# Patient Record
Sex: Female | Born: 1970 | Race: White | Hispanic: No | Smoking: Former smoker
Health system: Southern US, Community
[De-identification: ages and names within clinical notes are randomized; demographics above are authoritative.]

## PROBLEM LIST (undated history)

## (undated) DIAGNOSIS — E119 Type 2 diabetes mellitus without complications: Secondary | ICD-10-CM

## (undated) DIAGNOSIS — K219 Gastro-esophageal reflux disease without esophagitis: Secondary | ICD-10-CM

## (undated) DIAGNOSIS — F329 Major depressive disorder, single episode, unspecified: Secondary | ICD-10-CM

## (undated) DIAGNOSIS — F32A Depression, unspecified: Secondary | ICD-10-CM

## (undated) DIAGNOSIS — I10 Essential (primary) hypertension: Secondary | ICD-10-CM

## (undated) HISTORY — PX: ABDOMINAL HYSTERECTOMY: SHX81

## (undated) HISTORY — PX: ABLATION: SHX5711

## (undated) HISTORY — PX: TUBAL LIGATION: SHX77

## (undated) HISTORY — PX: TONSILLECTOMY AND ADENOIDECTOMY: SUR1326

## (undated) HISTORY — PX: BUNIONECTOMY: SHX129

## (undated) HISTORY — PX: KNEE SURGERY: SHX244

---

## 1997-05-15 ENCOUNTER — Inpatient Hospital Stay (HOSPITAL_COMMUNITY): Admission: AD | Admit: 1997-05-15 | Discharge: 1997-05-21 | Payer: Self-pay | Admitting: Obstetrics and Gynecology

## 1997-05-21 ENCOUNTER — Encounter: Admission: RE | Admit: 1997-05-21 | Discharge: 1997-08-19 | Payer: Self-pay | Admitting: Obstetrics & Gynecology

## 1998-08-06 ENCOUNTER — Other Ambulatory Visit: Admission: RE | Admit: 1998-08-06 | Discharge: 1998-08-06 | Payer: Self-pay | Admitting: Obstetrics & Gynecology

## 1998-12-03 ENCOUNTER — Other Ambulatory Visit: Admission: RE | Admit: 1998-12-03 | Discharge: 1998-12-03 | Payer: Self-pay | Admitting: Obstetrics & Gynecology

## 1999-05-05 ENCOUNTER — Other Ambulatory Visit: Admission: RE | Admit: 1999-05-05 | Discharge: 1999-05-05 | Payer: Self-pay | Admitting: Obstetrics & Gynecology

## 1999-05-06 ENCOUNTER — Other Ambulatory Visit: Admission: RE | Admit: 1999-05-06 | Discharge: 1999-05-06 | Payer: Self-pay | Admitting: Obstetrics & Gynecology

## 1999-10-16 ENCOUNTER — Inpatient Hospital Stay (HOSPITAL_COMMUNITY): Admission: AD | Admit: 1999-10-16 | Discharge: 1999-10-16 | Payer: Self-pay | Admitting: *Deleted

## 1999-10-27 ENCOUNTER — Ambulatory Visit (HOSPITAL_COMMUNITY): Admission: RE | Admit: 1999-10-27 | Discharge: 1999-10-27 | Payer: Self-pay | Admitting: Obstetrics & Gynecology

## 1999-10-27 ENCOUNTER — Encounter: Payer: Self-pay | Admitting: Obstetrics & Gynecology

## 1999-11-03 ENCOUNTER — Encounter: Payer: Self-pay | Admitting: Obstetrics and Gynecology

## 1999-11-04 ENCOUNTER — Inpatient Hospital Stay (HOSPITAL_COMMUNITY): Admission: AD | Admit: 1999-11-04 | Discharge: 1999-11-05 | Payer: Self-pay | Admitting: *Deleted

## 1999-11-12 ENCOUNTER — Inpatient Hospital Stay (HOSPITAL_COMMUNITY): Admission: AD | Admit: 1999-11-12 | Discharge: 1999-11-17 | Payer: Self-pay | Admitting: Obstetrics and Gynecology

## 1999-11-12 ENCOUNTER — Encounter: Payer: Self-pay | Admitting: Obstetrics and Gynecology

## 1999-11-14 ENCOUNTER — Encounter: Payer: Self-pay | Admitting: Obstetrics and Gynecology

## 1999-11-20 ENCOUNTER — Encounter: Payer: Self-pay | Admitting: Obstetrics and Gynecology

## 1999-11-20 ENCOUNTER — Encounter (INDEPENDENT_AMBULATORY_CARE_PROVIDER_SITE_OTHER): Payer: Self-pay

## 1999-11-20 ENCOUNTER — Inpatient Hospital Stay (HOSPITAL_COMMUNITY): Admission: AD | Admit: 1999-11-20 | Discharge: 1999-11-24 | Payer: Self-pay | Admitting: Obstetrics and Gynecology

## 1999-11-25 ENCOUNTER — Encounter: Admission: RE | Admit: 1999-11-25 | Discharge: 1999-12-10 | Payer: Self-pay | Admitting: Obstetrics & Gynecology

## 1999-11-27 ENCOUNTER — Inpatient Hospital Stay (HOSPITAL_COMMUNITY): Admission: AD | Admit: 1999-11-27 | Discharge: 1999-11-28 | Payer: Self-pay | Admitting: Obstetrics and Gynecology

## 2000-01-06 ENCOUNTER — Other Ambulatory Visit: Admission: RE | Admit: 2000-01-06 | Discharge: 2000-01-06 | Payer: Self-pay | Admitting: Obstetrics & Gynecology

## 2000-08-12 ENCOUNTER — Other Ambulatory Visit: Admission: RE | Admit: 2000-08-12 | Discharge: 2000-08-12 | Payer: Self-pay | Admitting: Obstetrics & Gynecology

## 2000-09-27 ENCOUNTER — Emergency Department (HOSPITAL_COMMUNITY): Admission: EM | Admit: 2000-09-27 | Discharge: 2000-09-27 | Payer: Self-pay | Admitting: Emergency Medicine

## 2001-08-28 ENCOUNTER — Other Ambulatory Visit: Admission: RE | Admit: 2001-08-28 | Discharge: 2001-08-28 | Payer: Self-pay | Admitting: Obstetrics & Gynecology

## 2001-09-22 ENCOUNTER — Ambulatory Visit (HOSPITAL_COMMUNITY): Admission: RE | Admit: 2001-09-22 | Discharge: 2001-09-22 | Payer: Self-pay | Admitting: Obstetrics & Gynecology

## 2002-09-28 ENCOUNTER — Other Ambulatory Visit: Admission: RE | Admit: 2002-09-28 | Discharge: 2002-09-28 | Payer: Self-pay | Admitting: Obstetrics and Gynecology

## 2003-10-07 ENCOUNTER — Other Ambulatory Visit: Admission: RE | Admit: 2003-10-07 | Discharge: 2003-10-07 | Payer: Self-pay | Admitting: Obstetrics & Gynecology

## 2004-08-07 ENCOUNTER — Encounter: Admission: RE | Admit: 2004-08-07 | Discharge: 2004-08-07 | Payer: Self-pay | Admitting: Sports Medicine

## 2004-11-04 ENCOUNTER — Other Ambulatory Visit: Admission: RE | Admit: 2004-11-04 | Discharge: 2004-11-04 | Payer: Self-pay | Admitting: Obstetrics & Gynecology

## 2007-04-10 ENCOUNTER — Emergency Department: Payer: Self-pay | Admitting: Emergency Medicine

## 2007-04-27 ENCOUNTER — Emergency Department: Payer: Self-pay | Admitting: Emergency Medicine

## 2007-10-05 ENCOUNTER — Encounter: Admission: RE | Admit: 2007-10-05 | Discharge: 2007-10-05 | Payer: Self-pay | Admitting: Gastroenterology

## 2007-10-05 HISTORY — PX: UPPER GI ENDOSCOPY: SHX6162

## 2007-11-10 ENCOUNTER — Ambulatory Visit: Payer: Self-pay | Admitting: Family Medicine

## 2008-02-20 ENCOUNTER — Encounter: Admission: RE | Admit: 2008-02-20 | Discharge: 2008-02-20 | Payer: Self-pay | Admitting: Orthopaedic Surgery

## 2009-04-08 LAB — HM COLONOSCOPY

## 2010-05-22 NOTE — Discharge Summary (Signed)
[facility] of [facility]  Patient:    Cindy Duncan, Cindy Duncan                            MRN: [id number] Adm. Date:  [id number] Disc. Date: [id number] Attending:  Cordelia Pen Ii Dictator:   Danie Chandler, R.N.                           Discharge Summary  ADMITTING DIAGNOSES:          Status post cesarean section at 65 1/2 weeks for toxemia.  Patient admitted with chills and temperature of 99.8, 99.4. Postpartum endometritis.  DISCHARGE DIAGNOSES:          Postpartum endometritis.  PROCEDURE:                    Intravenous antibiotics.  REASON FOR ADMISSION:         The patient is a 40 year old married white female gravida 3, para 2 who is status post cesarean section at 62 1/2 weeks for toxemia.  The patient received magnesium sulfate for 24 hours following delivery.  She was discharged home on November 24, 1999.  The patient was complaining of shaking and chills x 2 on November 25, 1999 and again on November 26, 1999.  Patient denied headache, visual change, epigastric pain. There was no periorbital edema.  Patients temperature was 99.8 and 99.4. Skin was hot and dry.  She had 1+ edema with 2+ reflexes without clonus.  On admission her hemoglobin was 10.7, white blood cell count 16.2 with neutrophils 82.  Creatinine 0.7, SGOT 47, SGPT 30, LDH 275, uric acid 7.2. The patient was admitted for probable postpartum endometritis and received IV antibiotics.  HOSPITAL COURSE:              On day #1 the patient had a temperature of 99.3. Her fundus was slightly tender.  She had no right upper quadrant tenderness. Reflexes were 2+ without clonus.  No edema.  Uric acid was 6.8.  LDH was 252. SGOT 46, SGPT 18.  Creatinine 0.6.  Hemoglobin 10.7, platelets 423,000.  On hospitalization day #2 the patient was discharged home.  She had been afebrile greater than 48 hours.  Her laboratories were good.  CONDITION ON DISCHARGE:       Good.  DIET:                         Regular,  as tolerated.  ACTIVITY:                     No heavy lifting, driving, vaginal entry.  FOLLOW-UP:                    She is to follow up in the office in one to two weeks.  She is to call for any PIH signs or symptoms or increased temperature.  DISCHARGE MEDICATIONS:        1. Augmentin 500 mg one t.i.d.                               2. Diflucan 150 mg x 1. DD:  12/22/99 TD:  12/22/99 Job: [id number]

## 2010-05-22 NOTE — H&P (Signed)
[facility] of [facility]  Patient:    Cindy Duncan, Cindy Duncan                            MRN: [id number] Adm. Date:  [id number] Disc. Date: [id number] Attending:  Rhina Brackett                         History and Physical  HISTORY OF PRESENT ILLNESS:   Cindy Duncan is a 40 year old, G3, A1, P1, with no living children, who has estimated date of confinement of January 10, 2000. Her pregnancy has been complicated by preeclampsia with recent increased in blood pressure over the last three to four weeks.  She has been on bed rest and hospitalized several times.  Two weeks ago, she had 3 g of protein in 24 hours with a normal creatinine clearance.  She has continued to maintain 2 to 3+ proteinuria with elevated blood pressures in the 140s to 150s/90s.  Today, she was seen in the office with 2 to 3+ protein, blood pressure 154/93.  Fetal heart rate was nonreactive.  She was sent to [facility] for biophysical which was 8/8.  Fetal monitoring after biophysical showed several decelerations after contractions.  She was given a contraction simulation test with nipple stimulation which was a positive CST with late decelerations after each contraction.  Because of positive CST and severe preeclampsia, plan to proceed with delivery.  The patient has had steroids two weeks ago on her hospitalization.  PHYSICAL EXAMINATION:  VITAL SIGNS:                  Blood pressure 158/93, 3+ proteinuria.  Weight is 172.  HEART:                        Regular rate and rhythm.  LUNGS:                        Clear to auscultation bilaterally.  ABDOMEN:                      Gravid, nontender.  PELVIC:                       Cervix is closed, thick, and high.  LABORATORY EVALUATION:        Significant for platelets of 249.  Uric acid 6.9.  LDH 198.  SGOT and SGPT within normal limits.  PAST SURGICAL HISTORY:        Significant for previous cesarean section with vertical cesarean section.  The  fetus died nine days later after severe IUGR.  IMPRESSION AND PLAN:          Intrauterine pregnancy at 32-5/7 weeks with severe preeclampsia and positive contraction stress test, also previous cesarean section with vertical for repeat cesarean section.  Plan is to deliver the fetus because the fetus has nonreassuring fetal surveillance and also severe preeclampsia.  Risks and benefits were discussed at length.  Informed consen6t was obtained.  We will plan magnesium sulfate after the cesarean section. DD:  11/20/99 TD:  11/20/99 Job: [id number]

## 2010-05-22 NOTE — Discharge Summary (Signed)
[facility]  Patient:    Cindy Duncan, Cindy Duncan                            MRN: [id number] Adm. Date:  [id number] Disc. Date: [id number] Attending:  Cordelia Pen Ii Dictator:   Danie Chandler, R.N.                           Discharge Summary  ADMITTING DIAGNOSES:          1. Intrauterine pregnancy at 31 1/[redacted] weeks                                  gestation.                               2. History of vertical cesarean section.                               3. History of intrauterine growth retardation,                                  pregnancy induced hypertension with last                                  pregnancy.                               4. Probable chronic hypertension with                                  superimposed pregnancy induced hypertension.  DISCHARGE DIAGNOSES:          1. Intrauterine pregnancy at 31 1/[redacted] weeks                                  gestation.                               2. History of vertical cesarean section.                               3. History of intrauterine growth retardation,                                  pregnancy induced hypertension with last                                  pregnancy.                               4. Probable chronic hypertension with  superimposed pregnancy induced hypertension.  REASON FOR ADMISSION:         The patient is a 40 year old gravida 2, para 0 with an estimated date of delivery of January 10, 2000.  The patient was admitted last week with increased blood pressure and 2-3+ proteinuria.  She received betamethasone.  She was discharged home on bed rest with daily RN care at home.  On November 12, 1999 the patient was complaining of increased facial edema.  In triage she had a reactive nonstress test, a BPP of 8/8.  Her AFI was 10.5 with her laboratories showing a UA with 30 mg of protein.  Her uric acid was 6.1, creatinine 0.7, SGOT and SGPT were  within normal limits. Hemoglobin was 13.3, hematocrit 37.2, white blood cell count 14.3, platelets 212,000.  The patient was admitted for repeat 24 hour urine and observation.  HOSPITAL COURSE:              On day #1 the patient was without headache, visual change, or right upper quadrant pain.  Her blood pressure was 112/83 and vital signs were stable.  Fetal heart tones were reactive.  Deep tendon reflexes were 2+ without clonus.  Her laboratories were okay.  BPP 10/10 with normal amniotic fluid volume.  The 24 hour urine was still in progress.  On hospitalization day #2 the patient was without complaints.  She continued to deny headache, visual change, or right upper quadrant pain.  Blood pressure 110/67, 94/57, and 128/90.  She had no right upper quadrant tenderness.  Deep tendon reflexes were 1+ without clonus.  She had no edema.  A 24 hour urine did show 1860 mg of protein.  On hospitalization day #3 the patients creatinine clearance was 125.  Nonstress test was reactive.  She continued to deny any headache, visual change, or right upper quadrant pain.  Blood pressure was stable.  On hospitalization day #4 blood pressure was still stable with reactive nonstress test.  She was discharged home on November 17, 1999.  CONDITION ON DISCHARGE:       Stable.  FOLLOW-UP:                    She is to follow up in the office on November 14 for ultrasound, nonstress test, and M.D. visit.  She is to take medications as previously ordered.  She is to be on bed rest with bathroom privileges only. She is to call for any signs or symptoms of PIH. DD:  12/22/99 TD:  12/22/99 Job: [id number]

## 2010-05-22 NOTE — Discharge Summary (Signed)
[facility] of [facility]  Patient:    Cindy Duncan, Cindy Duncan                            MRN: [id number] Adm. Date:  [id number] Disc. Date: 11/24/99 Attending:  Trevor Iha Dictator:   Danie Chandler, R.N.                           Discharge Summary  ADMITTING DIAGNOSES:          1. Intrauterine pregnancy at 32 5/[redacted] weeks                                  gestation.                               2. Severe preeclampsia.                               3. Nonreassuring fetal surveillance.                               4. Previous cesarean section of the vertical                                  fashion.  DISCHARGE DIAGNOSES:          1. Intrauterine pregnancy at 32 5/[redacted] weeks                                  gestation.                               2. Severe preeclampsia.                               3. Nonreassuring fetal surveillance.                               4. Previous cesarean section of the vertical                                  fashion.  PROCEDURE:                    On November 20, 1999 repeat low segment transverse cesarean section.  REASON FOR ADMISSION:         Please see dictated H&P.  HOSPITAL COURSE:              The patient was taken to the operating room and underwent the above named procedure without complication.  This was productive of a viable female infant with Apgars of 7 at one minute and 8 at five minutes. This baby did go to NICU.  On postoperative day #1 the patient was improved. She was on magnesium sulfate.  Her blood pressure was 130/70.  Her LDH was 421,  SGOT 45, SGPT 3, uric acid 7.1, platelets 202,000.  Her magnesium sulfate was weaned.  On postoperative day #2 the patient had good return of bowel function.  Her blood pressures were stable.  She was doing well off the magnesium sulfate.  On postoperative day #3 she was tolerating a regular diet, ambulating well without difficulty, had good pain control.  On postoperative day #4 she  was discharged home.  Hemoglobin 10.3, platelets 210,000.  She was in stable condition.  CONDITION ON DISCHARGE:       Good.  DIET:                         Regular, as tolerated.  ACTIVITY:                     No heavy lifting, driving, vaginal entry.  FOLLOW-UP:                    She is to follow up in the office in one to two weeks for incision check and she is to call for temperature greater than 100 degrees, any nausea or vomiting, or drainage from the incision site.  DISCHARGE MEDICATIONS:        1. Prenatal vitamin one p.o. q.d.                               2. Darvocet-N 100 #25 one to two p.o. q.4-6h.                                  p.r.n. pain. DD:  11/24/99 TD:  11/24/99 Job: [id number]

## 2010-05-22 NOTE — Op Note (Signed)
NAME:  Cindy Duncan, Cindy Duncan                             ACCOUNT NO.:  [id number]   MEDICAL RECORD NO.:  [id number]                   PATIENT TYPE:  AMB   LOCATION:  SDC                                  FACILITY:  [facility]   PHYSICIAN:  Freddy Finner, M.D.                DATE OF BIRTH:  [birth year]   DATE OF PROCEDURE:  DATE OF DISCHARGE:                                 OPERATIVE REPORT   PREOPERATIVE DIAGNOSIS:  Request for voluntary sterilization.   POSTOPERATIVE DIAGNOSIS:  Request for voluntary sterilization with slight  boggy enlargement of the uterus; no other significant pathology noted in the  pelvis or abdomen.   OPERATIVE PROCEDURE:  Laparoscopy, application of Filshie clips to fallopian  tubes.   INTRAOPERATIVE COMPLICATIONS:  None.   ESTIMATED INTRAOPERATIVE BLOOD LOSS:  Less than __________ 5 cc.   INDICATIONS:  The patient is a 40 year old white married female who has very  difficult pregnancies.  She has managed to achieve one viable pregnancy and  has one small child.  She has requested voluntary sterilization to avoid the  potential for further complicated pregnancies.   DESCRIPTION OF PROCEDURE:  She is admitted on the morning of surgery,  brought to the operating room, placed under adequate general anesthesia,  placed in the dorsal lithotomy position, Betadine prep of the abdomen,  perineum and vagina was carried out.  The bladder was evacuated with a  Robinson catheter.  Hulka tenaculum was attached to the cervix under direct  visualization.  Sterile drapes were applied.  Small intraumbilical skin  incision was made.  A #10-11 disposable trochar introduced while elevating  the anterior abdominal wall manually.  Direct inspection revealed adequate  placement and no evidence of injury on entry.  Pneumoperitoneum was allowed  to accumulate with carbon dioxide gas.  __________ inspection of the abdomen  and pelvis was carried out.  No apparent abnormalities were noted except  the  uterus was slightly boggy and a little irregular in overall contour that was  noted on definitive abnormal findings.  There was a recent corpus luteum on  the right ovary.  The was a scant amount of serosanguineous fluid in the  pelvis.  The Filshie clip applying device was evacuated through the  operating channel of the laparoscope and a Filshie clip applied to each  fallopian tube at the ascending portion without difficulty.  The procedure  was terminated at this point.  Photos were made and retained in the office  for documented findings.  Gas was allowed to escape from the abdomen and  instruments removed.  Half percent plain Marcaine was injected into the  incision site for postoperative analgesia.  The patient was given Toradol 30  mg IV.  The incision was closed with interrupted 3-0 Dexon sutures.  The  patient was awakened and taken to recovery in good condition.   INSTRUCTIONS TO PATIENT:  She will be given Darvocet to be taken  postoperatively as needed for pain which is unrelieved by ibuprofen.  She is  to return to the office in approximately 10-14 days for her postoperative  followup.  She was to call for fevers, severe pain, or heavy bleeding.                                               Freddy Finner, M.D.    WRN/MEDQ  D:  09/22/2001  T:  09/22/2001  Job:  [id number]

## 2010-05-22 NOTE — H&P (Signed)
[facility]  Patient:    Cindy Duncan, Cindy Duncan                            MRN: [id number] Adm. Date:  [id number] Attending:  Donne Hazel                         History and Physical  HISTORY OF PRESENT ILLNESS:   Ms. Cindy Duncan is a 40 year old G2, P1 with no living children who was seen in the office today with elevated blood pressures, blood pressure 140/98, with 3+ proteinuria (complicated by proteinuria in the last several weeks).  She had a 24-hour urine done on October 19, 1999, which showed a creatinine clearance of 172. and a total protein of 509 mg in 24 hours.  At that time, she was having 1+ proteinuria.  Today, she has 3+ proteinuria.  She denies any preeclampsia symptoms.  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  Her estimated date of confinement is January 10, 2000.  Ultrasound on October 12 showed an 1115 g infant which is the 50th percentile, amniotic fluid index of 16.9 which is 60th percentile in a vertex presentation.  PHYSICAL EXAMINATION:  VITAL SIGNS:                  Blood pressure is 140/98.  HEART:                        Regular rate and rhythm.  LUNGS:                        Clear to auscultation bilaterally.  ABDOMEN:                      Gravid, nontender.  Fundal height is 31 cm.  EXTREMITIES:                  DTRs are 2/4.  CERVIX:                       Deferred.  LABORATORY DATA:              Urinalysis:  3+ protein, weight 168.  IMPRESSION AND PLAN:          Intrauterine pregnancy at 30-2/7 weeks with probable preeclampsia.  Patient is currently on baby aspirin for her past medical history.  Plan is hospitalization for evaluation of blood pressure, 24-hour urine.  We will go ahead and give betamethasone in anticipation of preterm delivery and will also have fetal monitoring and physical  done to assess the fluid and well-being of the fetus. DD:  11/03/99 TD:  11/03/99 Job: [id number]

## 2010-05-22 NOTE — Op Note (Signed)
[facility] of [facility]  Patient:    Cindy Duncan, Cindy Duncan                            MRN: [id number] Proc. Date: 11/20/99 Adm. Date:  [id number] Disc. Date: [id number] Attending:  Rhina Brackett                           Operative Report  PREOPERATIVE DIAGNOSES:       Intrauterine pregnancy at 32 5/7 weeks, severe preeclampsia, nonreassuring fetal surveillance, previous cesarean section of the vertical fashion.  POSTOPERATIVE DIAGNOSES:      Intrauterine pregnancy at 32 5/7 weeks, severe preeclampsia, nonreassuring fetal surveillance, previous cesarean section of the vertical fashion.  PROCEDURE:                    Repeat cesarean section, low segment transverse cesarean section.  SURGEON:                      Trevor Iha, M.D.  ASSISTANT:                    Freddy Finner, M.D.  ANESTHESIA:                   Spinal.  ESTIMATED BLOOD LOSS:         800 cc.  INDICATIONS:                  [name] is a 40 year old G3, P1, A1 with no living children with severe preeclampsia, 3+ protein, elevated blood pressures status post betamethasone x 2.  Patient had a nonreactive NST today followed by an 8/8 biophysical profile, however, started having decelerations with fetal monitoring.  Contraction stress test was performed which was positive with late decelerations after each contraction.  Furthermore, with prolonged fetal monitoring occasional fetal bradycardia for one to two minutes would occur.  Because of nonreassuring fetal surveillance and severe preeclampsia, we proceeded with repeat cesarean section.  Her previous cesarean section was the vertical type for an infant with severe IUGR.  The baby expired nine days later.  FINDINGS:                     Viable female infant.  Apgars 7 and 8.  The pH is currently pending.  DESCRIPTION OF PROCEDURE:     After adequate analgesia, the patient placed in the supine position with left lateral tilt.  She is sterilely  prepped and draped.  Bladder was sterilely drained with a Foley catheter.  Pfannenstiel skin incision was made two fingerbreadths above the pubic symphysis which was taken to the fascia which was incised transversely, extended superiorly and inferiorly off the ______ rectus muscle which was separated sharply in the midline.  The peritoneum was then entered sharply.  Bladder blade was placed. Uterus serosa was elevated, nicked, incised transversely.  Bladder flap was created and placed behind the bladder blade.  A low segment myotomy incision was made down to the infants vertex and extended laterally with the operators fingertips.  The infants vertex was delivered atraumatically. Nares and pharynx were suctioned.  The infant was then delivered.  Cord was clamped.  Infant was handing to the pediatrician.  Good cry was noted.  Apgars were 7 and 8.  Uterus was exteriorized.  The placenta was extracted manually. The uterus was  wiped clean with a dry lap.  The myotomy incision was closed in two layers, the first being a running locking layer of 0 Monocryl, the second being an embrocating layer with good approximation and good hemostasis. Normal uterus, tubes, and ovaries were noted.  Uterus was placed back into the peritoneal cavity and after a copious amount of irrigation and adequate hemostasis was assured, the peritoneum was closed with 0 Panocryl, rectus muscle plicated in the midline.  Irrigation was once again applied and after adequate hemostasis the fascia was closed with a single layer of 0 Panocryl. Irrigation was once again applied and after adequate hemostasis the skin was stapled, Steri-Strips applied.  The patient tolerated procedure well.  Was stable on transfer to the recovery room.  The sponge, needle, and instrument count was normal x 3.  Estimated blood loss was 800 cc. DD:  11/20/99 TD:  11/20/99 Job: [id number]

## 2011-10-21 ENCOUNTER — Other Ambulatory Visit: Payer: Self-pay | Admitting: Gastroenterology

## 2011-10-21 DIAGNOSIS — R109 Unspecified abdominal pain: Secondary | ICD-10-CM

## 2011-10-25 ENCOUNTER — Ambulatory Visit
Admission: RE | Admit: 2011-10-25 | Discharge: 2011-10-25 | Disposition: A | Discharge status: Home / Self Care | Payer: PRIVATE HEALTH INSURANCE | Source: Ambulatory Visit | Attending: Gastroenterology | Admitting: Gastroenterology

## 2011-10-25 DIAGNOSIS — R109 Unspecified abdominal pain: Secondary | ICD-10-CM

## 2012-04-19 ENCOUNTER — Other Ambulatory Visit (HOSPITAL_COMMUNITY): Payer: Self-pay | Admitting: Obstetrics & Gynecology

## 2012-04-19 DIAGNOSIS — R1900 Intra-abdominal and pelvic swelling, mass and lump, unspecified site: Secondary | ICD-10-CM

## 2012-04-21 ENCOUNTER — Ambulatory Visit (HOSPITAL_COMMUNITY): Payer: PRIVATE HEALTH INSURANCE

## 2012-04-25 ENCOUNTER — Ambulatory Visit (HOSPITAL_COMMUNITY)
Admission: RE | Admit: 2012-04-25 | Discharge: 2012-04-25 | Disposition: A | Discharge status: Home / Self Care | Payer: BC Managed Care – PPO | Source: Ambulatory Visit | Attending: Obstetrics & Gynecology | Admitting: Obstetrics & Gynecology

## 2012-04-25 ENCOUNTER — Encounter (HOSPITAL_COMMUNITY): Payer: Self-pay

## 2012-04-25 DIAGNOSIS — N888 Other specified noninflammatory disorders of cervix uteri: Secondary | ICD-10-CM | POA: Insufficient documentation

## 2012-04-25 DIAGNOSIS — K7689 Other specified diseases of liver: Secondary | ICD-10-CM | POA: Insufficient documentation

## 2012-04-25 DIAGNOSIS — R1900 Intra-abdominal and pelvic swelling, mass and lump, unspecified site: Secondary | ICD-10-CM

## 2012-04-25 MED ORDER — IOHEXOL 300 MG/ML  SOLN
100.0000 mL | Freq: Once | INTRAMUSCULAR | Status: AC | PRN
  Administered 2012-04-25: 100 mL via INTRAVENOUS

## 2013-06-25 DIAGNOSIS — I493 Ventricular premature depolarization: Secondary | ICD-10-CM | POA: Insufficient documentation

## 2013-11-12 LAB — LIPID PANEL
Cholesterol: 231 mg/dL — AB (ref 0–200)
HDL: 49 mg/dL (ref 35–70)
LDL Cholesterol: 148 mg/dL
TRIGLYCERIDES: 170 mg/dL — AB (ref 40–160)

## 2013-11-12 LAB — CBC AND DIFFERENTIAL
HEMATOCRIT: 38 % (ref 36–46)
Hemoglobin: 13 g/dL (ref 12.0–16.0)
PLATELETS: 300 10*3/uL (ref 150–399)
WBC: 6 10^3/mL

## 2013-11-12 LAB — BASIC METABOLIC PANEL: Glucose: 78 mg/dL

## 2013-11-15 LAB — HEMOGLOBIN A1C: HEMOGLOBIN A1C: 5.6 % (ref 4.0–6.0)

## 2014-06-13 ENCOUNTER — Telehealth: Payer: Self-pay

## 2014-06-13 NOTE — Telephone Encounter (Signed)
Patient was seen on May 25th in [employer] system and all medications were refilled including Xanax 0.5 mg 1 po BID prn, #180 RF1. But pharmacy did not get it, spoke with pharmacist today-Laura-and gave a verbal. Patient advised.-aa

## 2014-11-05 DIAGNOSIS — F329 Major depressive disorder, single episode, unspecified: Secondary | ICD-10-CM | POA: Insufficient documentation

## 2014-11-05 DIAGNOSIS — R002 Palpitations: Secondary | ICD-10-CM | POA: Insufficient documentation

## 2014-11-05 DIAGNOSIS — D509 Iron deficiency anemia, unspecified: Secondary | ICD-10-CM | POA: Insufficient documentation

## 2014-11-05 DIAGNOSIS — R7303 Prediabetes: Secondary | ICD-10-CM | POA: Insufficient documentation

## 2014-11-05 DIAGNOSIS — G43909 Migraine, unspecified, not intractable, without status migrainosus: Secondary | ICD-10-CM | POA: Insufficient documentation

## 2014-11-05 DIAGNOSIS — I10 Essential (primary) hypertension: Secondary | ICD-10-CM | POA: Insufficient documentation

## 2014-11-05 DIAGNOSIS — F32A Depression, unspecified: Secondary | ICD-10-CM | POA: Insufficient documentation

## 2014-11-05 DIAGNOSIS — E669 Obesity, unspecified: Secondary | ICD-10-CM | POA: Insufficient documentation

## 2014-11-05 DIAGNOSIS — F410 Panic disorder [episodic paroxysmal anxiety] without agoraphobia: Secondary | ICD-10-CM | POA: Insufficient documentation

## 2014-11-05 DIAGNOSIS — F419 Anxiety disorder, unspecified: Secondary | ICD-10-CM | POA: Insufficient documentation

## 2014-11-05 DIAGNOSIS — E7800 Pure hypercholesterolemia, unspecified: Secondary | ICD-10-CM | POA: Insufficient documentation

## 2014-11-05 DIAGNOSIS — K219 Gastro-esophageal reflux disease without esophagitis: Secondary | ICD-10-CM | POA: Insufficient documentation

## 2014-11-05 DIAGNOSIS — Z79890 Hormone replacement therapy (postmenopausal): Secondary | ICD-10-CM | POA: Insufficient documentation

## 2014-11-05 DIAGNOSIS — F988 Other specified behavioral and emotional disorders with onset usually occurring in childhood and adolescence: Secondary | ICD-10-CM | POA: Insufficient documentation

## 2014-11-12 ENCOUNTER — Ambulatory Visit: Payer: Self-pay | Admitting: Family Medicine

## 2014-12-13 ENCOUNTER — Other Ambulatory Visit: Payer: Self-pay | Admitting: Family Medicine

## 2015-01-22 ENCOUNTER — Ambulatory Visit: Payer: Self-pay | Admitting: Family Medicine

## 2015-02-17 ENCOUNTER — Other Ambulatory Visit: Payer: Self-pay | Admitting: Family Medicine

## 2015-03-11 ENCOUNTER — Emergency Department
Admission: EM | Admit: 2015-03-11 | Discharge: 2015-03-11 | Disposition: A | Discharge status: Home / Self Care | Payer: BLUE CROSS/BLUE SHIELD | Attending: Emergency Medicine | Admitting: Emergency Medicine

## 2015-03-11 ENCOUNTER — Emergency Department: Payer: BLUE CROSS/BLUE SHIELD

## 2015-03-11 ENCOUNTER — Encounter: Payer: Self-pay | Admitting: Medical Oncology

## 2015-03-11 DIAGNOSIS — Z7984 Long term (current) use of oral hypoglycemic drugs: Secondary | ICD-10-CM | POA: Diagnosis not present

## 2015-03-11 DIAGNOSIS — Z79899 Other long term (current) drug therapy: Secondary | ICD-10-CM | POA: Insufficient documentation

## 2015-03-11 DIAGNOSIS — E119 Type 2 diabetes mellitus without complications: Secondary | ICD-10-CM | POA: Insufficient documentation

## 2015-03-11 DIAGNOSIS — H538 Other visual disturbances: Secondary | ICD-10-CM | POA: Insufficient documentation

## 2015-03-11 DIAGNOSIS — I10 Essential (primary) hypertension: Secondary | ICD-10-CM | POA: Diagnosis not present

## 2015-03-11 HISTORY — DX: Depression, unspecified: F32.A

## 2015-03-11 HISTORY — DX: Essential (primary) hypertension: I10

## 2015-03-11 HISTORY — DX: Major depressive disorder, single episode, unspecified: F32.9

## 2015-03-11 HISTORY — DX: Gastro-esophageal reflux disease without esophagitis: K21.9

## 2015-03-11 HISTORY — DX: Type 2 diabetes mellitus without complications: E11.9

## 2015-03-11 NOTE — ED Notes (Signed)
Patient transported to CT 

## 2015-03-11 NOTE — ED Provider Notes (Signed)
[facility] Emergency Department Provider Note  ____________________________________________  Time seen: 7:50 AM  I have reviewed the triage vital signs and the nursing notes.   HISTORY  Chief Complaint Eye Problem    HPI Cindy Duncan is a 45 y.o. female reports that around 7:00 AM as she was getting out of the shower this morning, she had a change in her vision with the left eye. It felt like when she was looking downward her left eye was crossed and she had blurry vision. No complaints in the right eye. No recent trauma. No drainage. No fevers or headaches. No numbness tingling or weakness. No neck pain. No recent eye injuries.     Past Medical History  Diagnosis Date  . Hypertension   . Diabetes mellitus without complication (HCC)   . Depression   . GERD (gastroesophageal reflux disease)      Patient Active Problem List   Diagnosis Date Noted  . Attention deficit disorder 11/05/2014  . Anxiety 11/05/2014  . Clinical depression 11/05/2014  . Gastro-esophageal reflux disease without esophagitis 11/05/2014  . Hypercholesterolemia 11/05/2014  . BP (high blood pressure) 11/05/2014  . Anemia, iron deficiency 11/05/2014  . Headache, migraine 11/05/2014  . Adiposity 11/05/2014  . Awareness of heartbeats 11/05/2014  . Panic attack 11/05/2014  . Need for prophylactic hormone replacement therapy (postmenopausal) 11/05/2014  . 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 Tubal ligation    . Ablation      uterine ablation  . Tonsillectomy and adenoidectomy    . Bunionectomy Bilateral   . Abdominal hysterectomy      complete  . Knee surgery      torn miniscus repair  . Cesarean section    . Upper gi endoscopy  10/05/07    normal stomach, duodenum, gerd without esophagitis     Current Outpatient Rx  Name  Route  Sig  Dispense  Refill  . ALPRAZolam (XANAX) 0.5 MG tablet      TAKE ONE TABLET BY  MOUTH TWICE DAILY AS NEEDED   180 tablet   1   . buPROPion (WELLBUTRIN XL) 300 MG 24 hr tablet   Oral   Take 300 mg by mouth daily.          . calcium carbonate (TUMS) 500 MG chewable tablet   Oral   Chew 2 tablets by mouth daily as needed for indigestion or heartburn.          . dexlansoprazole (DEXILANT) 60 MG capsule   Oral   Take 60 mg by mouth daily.          Marland Kitchen. docusate sodium (COLACE) 100 MG capsule   Oral   Take 100 mg by mouth daily.         Marland Kitchen. escitalopram (LEXAPRO) 5 MG tablet   Oral   Take 5 mg by mouth daily.          Marland Kitchen. estrogens, conjugated, (PREMARIN) 1.25 MG tablet   Oral   Take 1.25 mg by mouth daily.          . famotidine (PEPCID AC) 10 MG tablet   Oral   Take 10 mg by mouth daily.          Marland Kitchen. FIBER SELECT GUMMIES CHEW   Oral   Chew 2 Doses by mouth daily.          . hydrochlorothiazide (MICROZIDE) 12.5 MG capsule   Oral  Take 12.5 mg by mouth daily.         . magnesium oxide (MAG-OX) 400 MG tablet   Oral   Take 400 mg by mouth daily.          . metFORMIN (GLUCOPHAGE) 1000 MG tablet   Oral   Take 1,000 mg by mouth 2 (two) times daily with a meal.          . Multiple Vitamins-Iron (MULTI-VITAMIN/IRON PO)   Oral   Take 1 tablet by mouth every other day.         Marland Kitchen VICTOZA 18 MG/3ML SOPN      INJECT 1.2MCG UNDER THE SKIN DAILY   9 mL   2   . vitamin C (ASCORBIC ACID) 500 MG tablet   Oral   Take 500 mg by mouth daily.             Allergies Review of patient's allergies indicates no known allergies.   Family History  Problem Relation Age of Onset  . Hypothyroidism Mother   . Kidney disease Father   . Hypertension Father   . Dementia Father   . Muscular dystrophy Maternal Uncle   . Hypertension Paternal Aunt   . Stroke Maternal Grandmother   . Prostate cancer Maternal Grandfather   . Hypertension Paternal Grandmother   . Renal Disease Paternal Grandmother   . COPD Paternal Grandfather   . Hypertension  Paternal Grandfather   . Congestive Heart Failure Paternal Grandfather     Social History Social History  Substance Use Topics  . Smoking status: Never Smoker   . Smokeless tobacco: None  . Alcohol Use: None    Review of Systems  Constitutional:   No fever or chills. No weight changes Eyes:   Positive blurry vision of the left eye.  ENT:   No sore throat. No neck pain Cardiovascular:   No chest pain. No palpitations Skin:   Negative for rash. Neurological:   Negative for headaches, focal weakness or numbness. Psychiatric:  No anxiety or depression.     10-point ROS otherwise negative.  ____________________________________________   PHYSICAL EXAM:  VITAL SIGNS: ED Triage Vitals  Enc Vitals Group     BP 03/11/15 0738 145/94 mmHg     Pulse Rate 03/11/15 0738 90     Resp 03/11/15 0738 18     Temp 03/11/15 0738 98 F (36.7 C)     Temp Source 03/11/15 0738 Oral     SpO2 03/11/15 0738 97 %     Weight 03/11/15 0738 149 lb (67.586 kg)     Height --      Head Cir --      Peak Flow --      Pain Score --      Pain Loc --      Pain Edu? --      Excl. in GC? --     Vital signs reviewed, nursing assessments reviewed.   Constitutional:   Alert and oriented. Well appearing and in no distress. Eyes:   Normal appearance. No scleral icterus. No conjunctival pallor. PERRL. EOMI. Reports some discomfort with gaze to the left. Positive blurry vision. Funduscopy normal and the right eye was sharp optic discs, optic disc margins slightly blurred in the left. No pallor or hemorrhages in the fundus. ENT   Head:   Normocephalic and atraumatic. TMs normal bilaterally   Nose:   No congestion/rhinnorhea. No septal hematoma   Mouth/Throat:   MMM, no pharyngeal erythema. No  peritonsillar mass.    Neck:   No stridor. No SubQ emphysema. No meningismus. Hematological/Lymphatic/Immunilogical:   No cervical lymphadenopathy. Cardiovascular:   RRR. Symmetric bilateral radial and DP  pulses.  No murmurs.  Respiratory:   Normal respiratory effort without tachypnea nor retractions. Breath sounds are clear and equal bilaterally. No wheezes/rales/rhonchi. Neurologic:   Normal speech and language.  CN 2-10 normal. Motor grossly intact. No gross focal neurologic deficits are appreciated.  Psychiatric:   Mood and affect are normal. ____________________________________________    LABS (pertinent positives/negatives) (all labs ordered are listed, but only abnormal results are displayed) Labs Reviewed - No data to display ____________________________________________   EKG    ____________________________________________    RADIOLOGY  CT orbits unremarkable  ____________________________________________   PROCEDURES   ____________________________________________   INITIAL IMPRESSION / ASSESSMENT AND PLAN / ED COURSE  Pertinent labs & imaging results that were available during my care of the patient were reviewed by me and considered in my medical decision making (see chart for details).  Patient presents with blurry vision of the left eye. No evidence of any orbital or periorbital cellulitis. No evidence of any globe trauma or infection. Vital signs are unremarkable and normal. There does not appear to be any alteration and perfusion such as a retinal artery occlusion or venous occlusion. Low suspicion for glaucoma or temporal arteritis. CT scan performed to evaluate for mass effect or elevated pressure on the eye or optic neuritis, but found to be unremarkable. We'll have her follow-up with her ophthalmologist. She states that she has an established ophthalmologist and can see them in the next few days.     ____________________________________________   FINAL CLINICAL IMPRESSION(S) / ED DIAGNOSES  Final diagnoses:  Blurry vision, left eye      Sharman Cheek, MD 03/11/15 [phone]

## 2015-03-11 NOTE — ED Notes (Signed)
Pt reports at 0700 he left eye had some vision changes that lasted about 15 min. Pt denies headache and reports that the problem has now resolved. Denies weakness also.

## 2015-03-11 NOTE — Discharge Instructions (Signed)
Blurred Vision  Having blurred vision means that you cannot see things clearly. Your vision may seem fuzzy or out of focus. Blurred vision is a very common symptom of an eye or vision problem. Blurred vision is often a gradual blur that occurs in one eye or both eyes. There are many causes of blurred vision, including cataracts, macular degeneration, and diabetic retinopathy.  Blurred vision can be diagnosed based on your symptoms and a physical exam. Tell your health care provider about any other health problems you have, any recent eye injury, and any prior surgeries. You may need to see a health care provider who specializes in eye problems (ophthalmologist). Your treatment depends on what is causing your blurred vision.   HOME CARE INSTRUCTIONS   Tell your health care provider about any changes in your blurred vision.   Do not drive or operate heavy machinery if your vision is blurry.   Keep all follow-up visits as directed by your health care provider. This is important.  SEEK MEDICAL CARE IF:   Your symptoms get worse.   You have new symptoms.   You have trouble seeing at night.   You have trouble seeing up close or far away.   You have trouble noticing the difference between colors.  SEEK IMMEDIATE MEDICAL CARE IF:   You have severe eye pain.   You have a severe headache.   You have flashing lights in your field of vision.   You have a sudden change in vision.   You have a sudden loss of vision.   You have vision change after an injury.   You notice drainage coming from your eyes.   You notice a rash around your eyes.     This information is not intended to replace advice given to you by your health care provider. Make sure you discuss any questions you have with your health care provider.     Document Released: 12/24/2002 Document Revised: 05/07/2014 Document Reviewed: 11/14/2013  Elsevier Interactive Patient Education 2016 [employer]

## 2015-04-09 LAB — LIPID PANEL
CHOLESTEROL: 264 mg/dL — AB (ref 0–200)
HDL: 51 mg/dL (ref 35–70)
LDL Cholesterol: 139 mg/dL
LDl/HDL Ratio: 5.2
Triglycerides: 369 mg/dL — AB (ref 40–160)

## 2015-04-09 LAB — BASIC METABOLIC PANEL
BUN: 17 mg/dL (ref 4–21)
Creatinine: 0.8 mg/dL (ref 0.5–1.1)
GLUCOSE: 94 mg/dL
POTASSIUM: 4.2 mmol/L (ref 3.4–5.3)
Sodium: 138 mmol/L (ref 137–147)

## 2015-04-09 LAB — TSH: TSH: 1.82 u[IU]/mL (ref 0.41–5.90)

## 2015-04-09 LAB — HEPATIC FUNCTION PANEL
ALK PHOS: 68 U/L (ref 25–125)
ALT: 14 U/L (ref 7–35)
AST: 13 U/L (ref 13–35)
Bilirubin, Total: 0.2 mg/dL

## 2015-04-09 LAB — CBC AND DIFFERENTIAL
HEMATOCRIT: 44 % (ref 36–46)
Hemoglobin: 14.3 g/dL (ref 12.0–16.0)
NEUTROS ABS: 3 /uL
Platelets: 288 10*3/uL (ref 150–399)
WBC: 5.9 10*3/mL

## 2015-04-09 LAB — HEMOGLOBIN A1C: Hemoglobin A1C: 5.5

## 2015-04-24 ENCOUNTER — Encounter: Payer: Self-pay | Admitting: Family Medicine

## 2015-05-19 DIAGNOSIS — E119 Type 2 diabetes mellitus without complications: Secondary | ICD-10-CM | POA: Diagnosis not present

## 2015-05-19 LAB — HM DIABETES EYE EXAM

## 2015-06-11 ENCOUNTER — Encounter: Payer: Self-pay | Admitting: Family Medicine

## 2015-06-11 ENCOUNTER — Ambulatory Visit (INDEPENDENT_AMBULATORY_CARE_PROVIDER_SITE_OTHER): Payer: BLUE CROSS/BLUE SHIELD | Admitting: Family Medicine

## 2015-06-11 VITALS — BP 114/76 | HR 84 | Temp 97.7°F | Resp 16 | Ht 61.0 in | Wt 150.0 lb

## 2015-06-11 DIAGNOSIS — Z0000 Encounter for general adult medical examination without abnormal findings: Secondary | ICD-10-CM | POA: Diagnosis not present

## 2015-06-11 MED ORDER — ALPRAZOLAM 0.5 MG PO TABS: 0.5000 mg | ORAL_TABLET | Freq: Two times a day (BID) | ORAL | Status: DC | PRN

## 2015-06-11 MED ORDER — METFORMIN HCL 1000 MG PO TABS: 1000.0000 mg | ORAL_TABLET | Freq: Two times a day (BID) | ORAL | Status: DC

## 2015-06-11 MED ORDER — BUPROPION HCL ER (XL) 300 MG PO TB24: 300.0000 mg | ORAL_TABLET | Freq: Every day | ORAL | Status: DC

## 2015-06-11 MED ORDER — HYDROCHLOROTHIAZIDE 12.5 MG PO CAPS: 12.5000 mg | ORAL_CAPSULE | Freq: Every day | ORAL | Status: DC

## 2015-06-11 MED ORDER — ESCITALOPRAM OXALATE 5 MG PO TABS: 5.0000 mg | ORAL_TABLET | Freq: Every day | ORAL | Status: DC

## 2015-06-11 NOTE — Progress Notes (Signed)
Patient ID: Cindy Duncan, female   DOB: [birth year], 45 y.o.   MRN: [id number]       [name], Female    DOB: Dec 02, 1970, 45 y.o.   MRN: [id number] Visit Date: 06/11/2015  Today's Provider: Megan Mans, MD   Chief Complaint  Patient presents with  . Annual Exam   Subjective:    [name] is a 45 y.o. female who presents today for health maintenance and complete physical. She feels well. She reports exercising 4-5 times a week. Ab classes, and person trainer. She reports she is sleeping well.  ----------------------------------------------------------------- Colonoscopy- 07/24/13 Internal hemorrhoids repeat 10 years BMD- 03/16/11  Mammogram and pap- Dr. Jennette Kettle in [place], [place]  Pt reports that she is sure she has gotten a Tdap in the last couple of years trough her NP at work.    Review of Systems  Constitutional: Negative.   HENT: Negative.   Eyes: Negative.   Respiratory: Negative.   Cardiovascular: Negative.   Gastrointestinal: Negative.   Endocrine: Negative.   Genitourinary: Negative.   Musculoskeletal: Negative.   Skin: Negative.   Allergic/Immunologic: Negative.   Neurological: Negative.   Hematological: Negative.   Psychiatric/Behavioral: Negative.     Social History      She  reports that she has quit smoking. She does not have any smokeless tobacco history on file. She reports that she does not drink alcohol or use illicit drugs.       Social History   Social History  . Marital Status: Legally Separated    Spouse Name: N/A  . Number of Children: N/A  . Years of Education: N/A   Social History Main Topics  . Smoking status: Former Smoker -- 1.00 packs/day for 8 years  . Smokeless tobacco: None  . Alcohol Use: No  . Drug Use: No  . Sexual Activity: Not Asked   Other Topics Concern  . None   Social History Narrative    Past Medical History  Diagnosis Date  . Hypertension   . Diabetes mellitus without  complication (HCC)   . Depression   . GERD (gastroesophageal reflux disease)      Patient Active Problem List   Diagnosis Date Noted  . Attention deficit disorder 11/05/2014  . Anxiety 11/05/2014  . Clinical depression 11/05/2014  . Gastro-esophageal reflux disease without esophagitis 11/05/2014  . Hypercholesterolemia 11/05/2014  . BP (high blood pressure) 11/05/2014  . Anemia, iron deficiency 11/05/2014  . Headache, migraine 11/05/2014  . Adiposity 11/05/2014  . Awareness of heartbeats 11/05/2014  . Panic attack 11/05/2014  . Need for prophylactic hormone replacement therapy (postmenopausal) 11/05/2014  . 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 Beat, premature ventricular 06/25/2013    Past Surgical History  Procedure Laterality Date  . Tubal ligation    . Ablation      uterine ablation  . Tonsillectomy and adenoidectomy    . Bunionectomy Bilateral   . Abdominal hysterectomy      complete  . Knee surgery      torn miniscus repair  . Cesarean section    . Upper gi endoscopy  10/05/07    normal stomach, duodenum, gerd without esophagitis    Family History        Family Status  Relation Status Death Age  . Mother Alive   . Son Alive   . Maternal Grandmother Deceased   . Maternal Grandfather Deceased   . Paternal Grandmother Deceased   . Paternal  Grandfather Deceased         Her family history includes COPD in her paternal grandfather; Congestive Heart Failure in her paternal grandfather; Dementia in her father; Hypertension in her father, paternal aunt, paternal grandfather, and paternal grandmother; Hypothyroidism in her mother; Kidney disease in her father; Muscular dystrophy in her maternal uncle; Prostate cancer in her maternal grandfather; Renal Disease in her paternal grandmother; Stroke in her maternal grandmother.    No Known Allergies  Current Meds  Medication Sig  . ALPRAZolam (XANAX) 0.5 MG tablet TAKE ONE TABLET BY MOUTH TWICE DAILY AS NEEDED  .  buPROPion (WELLBUTRIN XL) 300 MG 24 hr tablet Take 300 mg by mouth daily.   . calcium carbonate (TUMS) 500 MG chewable tablet Chew 2 tablets by mouth daily as needed for indigestion or heartburn.   . dexlansoprazole (DEXILANT) 60 MG capsule Take 60 mg by mouth daily.   Marland Kitchen docusate sodium (COLACE) 100 MG capsule Take 100 mg by mouth daily.  Marland Kitchen escitalopram (LEXAPRO) 5 MG tablet Take 5 mg by mouth daily.   Marland Kitchen estrogens, conjugated, (PREMARIN) 1.25 MG tablet Take 1.25 mg by mouth daily.   . famotidine (PEPCID AC) 10 MG tablet Take 10 mg by mouth daily.   Marland Kitchen FIBER SELECT GUMMIES CHEW Chew 2 Doses by mouth daily.   . hydrochlorothiazide (MICROZIDE) 12.5 MG capsule Take 12.5 mg by mouth daily.  . magnesium oxide (MAG-OX) 400 MG tablet Take 400 mg by mouth daily.   . metFORMIN (GLUCOPHAGE) 1000 MG tablet Take 1,000 mg by mouth 2 (two) times daily with a meal.   . VICTOZA 18 MG/3ML SOPN INJECT 1.2MCG UNDER THE SKIN DAILY    Patient Care Team: Maple Hudson., MD as PCP - General (Family Medicine)     Objective:   Vitals: BP 114/76 mmHg  Pulse 84  Temp(Src) 97.7 F (36.5 C) (Oral)  Resp 16  Ht 5\' 1"  (1.549 m)  Wt 150 lb (68.04 kg)  BMI 28.36 kg/m2   Physical Exam  Constitutional: She is oriented to person, place, and time. She appears well-developed and well-nourished.  HENT:  Head: Normocephalic and atraumatic.  Right Ear: External ear normal.  Left Ear: External ear normal.  Nose: Nose normal.  Mouth/Throat: Oropharynx is clear and moist.  Eyes: Conjunctivae and EOM are normal. Pupils are equal, round, and reactive to light.  Neck: Normal range of motion. Neck supple.  Cardiovascular: Normal rate, regular rhythm, normal heart sounds and intact distal pulses.   Pulmonary/Chest: Effort normal and breath sounds normal.  Abdominal: Soft. Bowel sounds are normal.  Musculoskeletal: Normal range of motion.  Neurological: She is alert and oriented to person, place, and time. She has  normal reflexes.  Skin: Skin is warm and dry.  Psychiatric: She has a normal mood and affect. Her behavior is normal. Judgment and thought content normal.     Depression Screen PHQ 2/9 Scores 06/11/2015  PHQ - 2 Score 0      Assessment & Plan:     Routine Health Maintenance and Physical Exam  Exercise Activities and Dietary recommendations Goals    None       There is no immunization history on file for this patient.  Health Maintenance  Topic Date Due  . HIV Screening  04/11/1984  . TETANUS/TDAP  04/11/1988  . PAP SMEAR  04/12/1990  . INFLUENZA VACCINE  08/05/2015   Gyn care by Dr Jennette Kettle.  Family history is positive for mother with breast cancer  and depression.Father with renal failure and hypertension and depression and now recent onset of dementia.She has started regular exercise the past coiuple of years and feels much better overall since making this change in her lifestyle. Discussed health benefits of physical activity, and encouraged her to engage in regular exercise appropriate for her age and condition.    --------------------------------------------------------------------

## 2015-08-11 ENCOUNTER — Ambulatory Visit (INDEPENDENT_AMBULATORY_CARE_PROVIDER_SITE_OTHER): Payer: BLUE CROSS/BLUE SHIELD | Admitting: Podiatry

## 2015-08-11 ENCOUNTER — Ambulatory Visit: Payer: Self-pay

## 2015-08-11 ENCOUNTER — Encounter: Payer: Self-pay | Admitting: Podiatry

## 2015-08-11 VITALS — BP 130/81 | HR 79 | Resp 16

## 2015-08-11 DIAGNOSIS — M79671 Pain in right foot: Secondary | ICD-10-CM

## 2015-08-11 DIAGNOSIS — M779 Enthesopathy, unspecified: Secondary | ICD-10-CM

## 2015-08-11 NOTE — Progress Notes (Signed)
   Subjective:    Patient ID: Cindy Duncan, female    DOB: 03/16/1970, 45 y.o.   MRN: [id number]  HPI: She presents today with chief complaint of painful fourth digit to the right foot. States it has been like this for several weeks now and seems to be getting worse as time goes on. She has no radiating pain just pain within the joint itself. Worse in the mornings after she's been on her feet playing tennis. She also wears high heels but because she is short.    Review of Systems  All other systems reviewed and are negative.      Objective:   Physical Exam: Vital signs are stable alert and oriented 3 pulses are palpable. Neurologic sensorium is intact. Deep tendon reflexes are intact. Muscle strength is normal. She has pain on range of motion particularly in range of motion with plantar flexion dorsiflexion fourth digit right foot. No pain on palpation proximal phalanx. No pain on palpation fourth metatarsal. No pain on palpation between the third and fourth metatarsals. Radiographs taken today do not demonstrate any type of osseus abnormalities in the area..        Assessment & Plan:  Capsulitis fourth digit right.  Plan: Discussed etiology pathology concerning her surgical therapies. After sterile Betadine skin prep I injected the fourth metatarsophalangeal joint of the right foot today she tolerated procedure well I will follow up with her in 1 month if not completely resolved.

## 2016-02-09 DIAGNOSIS — Z6828 Body mass index (BMI) 28.0-28.9, adult: Secondary | ICD-10-CM | POA: Diagnosis not present

## 2016-02-09 DIAGNOSIS — Z01419 Encounter for gynecological examination (general) (routine) without abnormal findings: Secondary | ICD-10-CM | POA: Diagnosis not present

## 2016-02-09 DIAGNOSIS — Z1231 Encounter for screening mammogram for malignant neoplasm of breast: Secondary | ICD-10-CM | POA: Diagnosis not present

## 2016-04-09 LAB — LIPID PANEL
Cholesterol: 263 mg/dL — AB (ref 0–200)
HDL: 50 mg/dL (ref 35–70)
LDL CALC: 162 mg/dL
LDL/HDL RATIO: 5.3
TRIGLYCERIDES: 256 mg/dL — AB (ref 40–160)

## 2016-04-09 LAB — BASIC METABOLIC PANEL
BUN: 20 mg/dL (ref 4–21)
CREATININE: 0.9 mg/dL (ref 0.5–1.1)
GLUCOSE: 99 mg/dL
POTASSIUM: 4.5 mmol/L (ref 3.4–5.3)
SODIUM: 141 mmol/L (ref 137–147)

## 2016-04-09 LAB — CBC AND DIFFERENTIAL
HCT: 42 % (ref 36–46)
HEMOGLOBIN: 14 g/dL (ref 12.0–16.0)
NEUTROS ABS: 3 /uL
PLATELETS: 306 10*3/uL (ref 150–399)
WBC: 5.8 10^3/mL

## 2016-04-09 LAB — HEMOGLOBIN A1C: Hemoglobin A1C: 5.5

## 2016-04-09 LAB — TSH: TSH: 1.77 u[IU]/mL (ref 0.41–5.90)

## 2016-04-09 LAB — HEPATIC FUNCTION PANEL
ALT: 17 U/L (ref 7–35)
AST: 12 U/L — AB (ref 13–35)
Alkaline Phosphatase: 61 U/L (ref 25–125)
BILIRUBIN, TOTAL: 0.2 mg/dL

## 2016-04-09 LAB — VITAMIN D 25 HYDROXY (VIT D DEFICIENCY, FRACTURES): Vit D, 25-Hydroxy: 33.7

## 2016-04-13 ENCOUNTER — Encounter: Payer: Self-pay | Admitting: Family Medicine

## 2016-04-21 ENCOUNTER — Encounter: Payer: Self-pay | Admitting: Family Medicine

## 2016-05-05 DIAGNOSIS — E119 Type 2 diabetes mellitus without complications: Secondary | ICD-10-CM | POA: Diagnosis not present

## 2016-06-09 ENCOUNTER — Other Ambulatory Visit: Payer: Self-pay | Admitting: Family Medicine

## 2016-08-09 ENCOUNTER — Ambulatory Visit
Admission: RE | Admit: 2016-08-09 | Discharge: 2016-08-09 | Disposition: A | Discharge status: Home / Self Care | Payer: BLUE CROSS/BLUE SHIELD | Source: Ambulatory Visit | Attending: Orthopedic Surgery | Admitting: Orthopedic Surgery

## 2016-08-09 ENCOUNTER — Other Ambulatory Visit: Payer: Self-pay | Admitting: Orthopedic Surgery

## 2016-08-09 ENCOUNTER — Other Ambulatory Visit: Payer: Self-pay | Admitting: Family Medicine

## 2016-08-09 DIAGNOSIS — M170 Bilateral primary osteoarthritis of knee: Secondary | ICD-10-CM | POA: Diagnosis not present

## 2016-08-09 DIAGNOSIS — M25562 Pain in left knee: Principal | ICD-10-CM

## 2016-08-09 DIAGNOSIS — M25461 Effusion, right knee: Secondary | ICD-10-CM | POA: Diagnosis not present

## 2016-08-09 DIAGNOSIS — M25561 Pain in right knee: Secondary | ICD-10-CM

## 2016-08-09 DIAGNOSIS — M25462 Effusion, left knee: Secondary | ICD-10-CM | POA: Diagnosis not present

## 2016-08-09 DIAGNOSIS — M1712 Unilateral primary osteoarthritis, left knee: Secondary | ICD-10-CM | POA: Diagnosis not present

## 2016-08-09 DIAGNOSIS — F411 Generalized anxiety disorder: Secondary | ICD-10-CM

## 2016-08-09 NOTE — Telephone Encounter (Signed)
Please review for Dr Gilbert-aa 

## 2016-08-09 NOTE — Telephone Encounter (Signed)
Called into [facility]

## 2016-08-11 DIAGNOSIS — M2242 Chondromalacia patellae, left knee: Secondary | ICD-10-CM | POA: Diagnosis not present

## 2016-08-11 DIAGNOSIS — M2241 Chondromalacia patellae, right knee: Secondary | ICD-10-CM | POA: Diagnosis not present

## 2016-09-09 ENCOUNTER — Other Ambulatory Visit: Payer: Self-pay | Admitting: Family Medicine

## 2016-09-22 ENCOUNTER — Other Ambulatory Visit: Payer: Self-pay | Admitting: Family Medicine

## 2016-09-27 LAB — HEPATIC FUNCTION PANEL
ALK PHOS: 69 (ref 25–125)
ALT: 17 (ref 7–35)
AST: 17 (ref 13–35)
BILIRUBIN, TOTAL: 0.2

## 2016-09-27 LAB — LIPID PANEL
CHOLESTEROL: 245 — AB (ref 0–200)
HDL: 52 (ref 35–70)
LDL Cholesterol: 151
Triglycerides: 208 — AB (ref 40–160)

## 2016-09-27 LAB — BASIC METABOLIC PANEL
BUN: 14 (ref 4–21)
Creatinine: 0.8 (ref 0.5–1.1)
Glucose: 87
Potassium: 4.2 (ref 3.4–5.3)
Sodium: 139 (ref 137–147)

## 2016-09-27 LAB — HEMOGLOBIN A1C: Hemoglobin A1C: 5.3

## 2016-09-28 LAB — FERRITIN: Ferritin: 18

## 2016-09-29 ENCOUNTER — Encounter: Payer: BLUE CROSS/BLUE SHIELD | Admitting: Family Medicine

## 2016-10-04 ENCOUNTER — Encounter: Payer: Self-pay | Admitting: Family Medicine

## 2016-10-05 ENCOUNTER — Ambulatory Visit (INDEPENDENT_AMBULATORY_CARE_PROVIDER_SITE_OTHER): Payer: BLUE CROSS/BLUE SHIELD | Admitting: Family Medicine

## 2016-10-05 ENCOUNTER — Encounter: Payer: Self-pay | Admitting: Family Medicine

## 2016-10-05 VITALS — BP 110/72 | HR 82 | Temp 97.7°F | Resp 14 | Ht 61.0 in | Wt 143.0 lb

## 2016-10-05 DIAGNOSIS — Z0000 Encounter for general adult medical examination without abnormal findings: Secondary | ICD-10-CM | POA: Diagnosis not present

## 2016-10-05 DIAGNOSIS — Z23 Encounter for immunization: Secondary | ICD-10-CM | POA: Diagnosis not present

## 2016-10-05 DIAGNOSIS — F411 Generalized anxiety disorder: Secondary | ICD-10-CM | POA: Diagnosis not present

## 2016-10-05 MED ORDER — ESCITALOPRAM OXALATE 5 MG PO TABS: 5.0000 mg | ORAL_TABLET | Freq: Every day | ORAL | 3 refills | Status: DC

## 2016-10-05 MED ORDER — HYDROCHLOROTHIAZIDE 12.5 MG PO CAPS: 12.5000 mg | ORAL_CAPSULE | Freq: Every day | ORAL | 3 refills | Status: DC

## 2016-10-05 MED ORDER — BUPROPION HCL ER (XL) 300 MG PO TB24: 300.0000 mg | ORAL_TABLET | Freq: Every day | ORAL | 3 refills | Status: DC

## 2016-10-05 MED ORDER — ALPRAZOLAM 0.5 MG PO TABS: 0.5000 mg | ORAL_TABLET | Freq: Two times a day (BID) | ORAL | 1 refills | Status: DC | PRN

## 2016-10-05 NOTE — Progress Notes (Signed)
[name], Female    DOB: [birth year], 46 y.o.   MRN: [id number] Visit Date: 10/05/2016  Today's Provider: Megan Mans, MD   Chief Complaint  Patient presents with  . Annual Exam   Subjective:    [name] is a 46 y.o. female who presents today for health maintenance and complete physical. She feels well. She reports exercising 6 days a week. She reports she is sleeping well.  ----------------------------------------------------------------- Colonoscopy- 07/24/13 internal hemorrhoids, repeat 10 years BMD-03/16/11 osteopenia Pt sees GYN.  Review of Systems  Constitutional: Negative.   HENT: Negative.   Eyes: Negative.   Respiratory: Negative.   Cardiovascular: Negative.   Gastrointestinal: Negative.   Endocrine: Negative.   Genitourinary: Negative.   Musculoskeletal: Negative.   Skin: Negative.   Allergic/Immunologic: Negative.   Neurological: Negative.   Hematological: Negative.   Psychiatric/Behavioral: Negative.     Social History      She  reports that she has quit smoking. She has a 8.00 pack-year smoking history. She has never used smokeless tobacco. She reports that she does not drink alcohol or use drugs.       Social History   Social History  . Marital status: Legally Separated    Spouse name: N/A  . Number of children: N/A  . Years of education: N/A   Social History Main Topics  . Smoking status: Former Smoker    Packs/day: 1.00    Years: 8.00  . Smokeless tobacco: Never Used  . Alcohol use No  . Drug use: No  . Sexual activity: Not Asked   Other Topics Concern  . None   Social History Narrative  . None    Past Medical History:  Diagnosis Date  . Depression   . Diabetes mellitus without complication (HCC)   . GERD (gastroesophageal reflux disease)   . Hypertension      Patient Active Problem List   Diagnosis Date Noted  . Attention deficit disorder 11/05/2014  . Anxiety 11/05/2014  .  Clinical depression 11/05/2014  . Gastro-esophageal reflux disease without esophagitis 11/05/2014  . Hypercholesterolemia 11/05/2014  . BP (high blood pressure) 11/05/2014  . Anemia, iron deficiency 11/05/2014  . Headache, migraine 11/05/2014  . Adiposity 11/05/2014  . Awareness of heartbeats 11/05/2014  . Panic attack 11/05/2014  . Need for prophylactic hormone replacement therapy (postmenopausal) 11/05/2014  . 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 Beat, premature ventricular 06/25/2013    Past Surgical History:  Procedure Laterality Date  . ABDOMINAL HYSTERECTOMY     complete  . ABLATION     uterine ablation  . BUNIONECTOMY Bilateral   . CESAREAN SECTION    . KNEE SURGERY     torn miniscus repair  . TONSILLECTOMY AND ADENOIDECTOMY    . TUBAL LIGATION    . UPPER GI ENDOSCOPY  10/05/07   normal stomach, duodenum, gerd without esophagitis    Family History        Family Status  Relation Status  . Mother Alive  . Son Alive  . MGM Deceased  . MGF Deceased  . PGM Deceased  . PGF Deceased  . Father (Not Specified)  . Mat Uncle (Not Specified)  . Emelda Brothers (Not Specified)        Her family history includes COPD in her paternal grandfather; Congestive Heart Failure in her paternal grandfather; Dementia in her father; Hypertension in her father, paternal aunt, paternal grandfather, and paternal grandmother; Hypothyroidism in  her mother; Kidney disease in her father; Muscular dystrophy in her maternal uncle; Prostate cancer in her maternal grandfather; Renal Disease in her paternal grandmother; Stroke in her maternal grandmother.     No Known Allergies   Current Outpatient Prescriptions:  .  ALPRAZolam (XANAX) 0.5 MG tablet, TAKE ONE TABLET BY MOUTH TWICE DAILY AS NEEDED, Disp: 180 tablet, Rfl: 0 .  buPROPion (WELLBUTRIN XL) 300 MG 24 hr tablet, TAKE ONE TABLET BY MOUTH EVERY DAY, Disp: 90 tablet, Rfl: 0 .  dexlansoprazole (DEXILANT) 60 MG capsule, Take 60 mg by mouth daily. ,  Disp: , Rfl:  .  docusate sodium (COLACE) 100 MG capsule, Take 100 mg by mouth daily., Disp: , Rfl:  .  escitalopram (LEXAPRO) 5 MG tablet, TAKE ONE TABLET BY MOUTH EVERY DAY, Disp: 90 tablet, Rfl: 0 .  estrogens, conjugated, (PREMARIN) 1.25 MG tablet, Take 1.25 mg by mouth daily. , Disp: , Rfl:  .  famotidine (PEPCID AC) 10 MG tablet, Take 10 mg by mouth daily. , Disp: , Rfl:  .  Fe-Succ-C-Thre-B12-Des Stomach 70 MG TABS, Take by mouth., Disp: , Rfl:  .  FIBER SELECT GUMMIES CHEW, Chew 2 Doses by mouth daily. , Disp: , Rfl:  .  hydrochlorothiazide (MICROZIDE) 12.5 MG capsule, TAKE ONE CAPSULE BY MOUTH EVERY DAY, Disp: 90 capsule, Rfl: 0 .  magnesium oxide (MAG-OX) 400 MG tablet, Take 400 mg by mouth daily. , Disp: , Rfl:  .  metFORMIN (GLUCOPHAGE) 1000 MG tablet, TAKE 1 TABLET TWICE DAILY WITH MEALS, Disp: 180 tablet, Rfl: 0 .  Multiple Vitamins-Iron (MULTI-VITAMIN/IRON PO), Take 1 tablet by mouth every other day. Reported on 06/11/2015, Disp: , Rfl:  .  VICTOZA 18 MG/3ML SOPN, INJECT 1.2MCG UNDER THE SKIN DAILY, Disp: 9 mL, Rfl: 2 .  calcium carbonate (TUMS) 500 MG chewable tablet, Chew 2 tablets by mouth daily as needed for indigestion or heartburn. , Disp: , Rfl:    Patient Care Team: Maple Hudson., MD as PCP - General (Family Medicine)      Objective:   Vitals: BP 110/72 (BP Location: Left Arm, Patient Position: Sitting, Cuff Size: Normal)   Pulse 82   Temp 97.7 F (36.5 C) (Oral)   Resp 14   Ht  (1.549 m)   Wt 143 lb (64.9 kg)   BMI 27.02 kg/m    Vitals:   10/05/16 1420  BP: 110/72  Pulse: 82  Resp: 14  Temp: 97.7 F (36.5 C)  TempSrc: Oral  Weight: 143 lb (64.9 kg)  Height:  (1.549 m)     Physical Exam  Constitutional: She is oriented to person, place, and time. She appears well-developed and well-nourished.  HENT:  Head: Normocephalic and atraumatic.  Right Ear: External ear normal.  Left Ear: External ear normal.  Nose: Nose normal.    Mouth/Throat: Oropharynx is clear and moist.  Eyes: Pupils are equal, round, and reactive to light. Conjunctivae and EOM are normal.  Neck: Normal range of motion. Neck supple.  Cardiovascular: Normal rate, regular rhythm, normal heart sounds and intact distal pulses.   Pulmonary/Chest: Effort normal and breath sounds normal.  Abdominal: Soft. Bowel sounds are normal.  Musculoskeletal: Normal range of motion.  Neurological: She is alert and oriented to person, place, and time. She has normal reflexes.  Skin: Skin is warm and dry.  Psychiatric: She has a normal mood and affect. Her behavior is normal. Judgment and thought content normal.     Depression Screen  PHQ 2/9 Scores 10/05/2016 06/11/2015  PHQ - 2 Score 0 0  PHQ- 9 Score 0 -      Assessment & Plan:     Routine Health Maintenance and Physical Exam  Exercise Activities and Dietary recommendations Goals    None      There is no immunization history for the selected administration types on file for this patient.  Health Maintenance  Topic Date Due  . HIV Screening  04/11/1984  . INFLUENZA VACCINE  08/04/2016  . TETANUS/TDAP  12/31/2016 (Originally 04/11/1988)  . PAP SMEAR  Excluded    Pelvic/Breast/DRE per Gyn. Discussed health benefits of physical activity, and encouraged her to engage in regular exercise appropriate for her age and condition.  MDD In remission.   --------------------------------------------------------------------    Megan Mans, MD  [facility]

## 2016-10-05 NOTE — Patient Instructions (Signed)
Check with employer about Tdap/Td vaccine. If you have not gotten the vaccine, please let us know.

## 2016-10-13 ENCOUNTER — Encounter: Payer: Self-pay | Admitting: Family Medicine

## 2016-10-13 DIAGNOSIS — S83242A Other tear of medial meniscus, current injury, left knee, initial encounter: Secondary | ICD-10-CM | POA: Diagnosis not present

## 2016-10-20 DIAGNOSIS — M25562 Pain in left knee: Secondary | ICD-10-CM | POA: Diagnosis not present

## 2016-12-22 DIAGNOSIS — S83242A Other tear of medial meniscus, current injury, left knee, initial encounter: Secondary | ICD-10-CM | POA: Diagnosis not present

## 2016-12-22 DIAGNOSIS — G8918 Other acute postprocedural pain: Secondary | ICD-10-CM | POA: Diagnosis not present

## 2016-12-22 DIAGNOSIS — S83282A Other tear of lateral meniscus, current injury, left knee, initial encounter: Secondary | ICD-10-CM | POA: Diagnosis not present

## 2016-12-22 DIAGNOSIS — Y999 Unspecified external cause status: Secondary | ICD-10-CM | POA: Diagnosis not present

## 2016-12-22 DIAGNOSIS — M2242 Chondromalacia patellae, left knee: Secondary | ICD-10-CM | POA: Diagnosis not present

## 2017-01-05 DIAGNOSIS — S83282D Other tear of lateral meniscus, current injury, left knee, subsequent encounter: Secondary | ICD-10-CM | POA: Diagnosis not present

## 2017-01-05 DIAGNOSIS — S83242D Other tear of medial meniscus, current injury, left knee, subsequent encounter: Secondary | ICD-10-CM | POA: Diagnosis not present

## 2017-02-16 DIAGNOSIS — S83282D Other tear of lateral meniscus, current injury, left knee, subsequent encounter: Secondary | ICD-10-CM | POA: Diagnosis not present

## 2017-02-16 DIAGNOSIS — S83242D Other tear of medial meniscus, current injury, left knee, subsequent encounter: Secondary | ICD-10-CM | POA: Diagnosis not present

## 2017-03-29 ENCOUNTER — Encounter: Payer: Self-pay | Admitting: Family Medicine

## 2017-04-05 IMAGING — CT CT ORBITS W/O CM
3 series · 14 of 47 positions shown, 16 images · non-contrast
Comparison: 11/10/2007

CLINICAL DATA: Blurry vision since 7777

EXAM:
CT ORBITS WITHOUT CONTRAST
TECHNIQUE: Multidetector CT imaging of the orbits was performed following the
standard protocol without intravenous contrast.

[Series 2: orbits soft · axial · 0.30mm/px · z∈[-112,-32]mm · 8 of 48 slices shown, 10 images]
[im 4/48  brain]
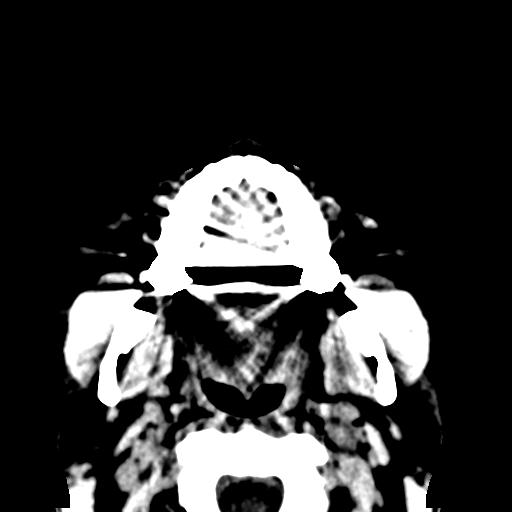
[im 4/48  bone]
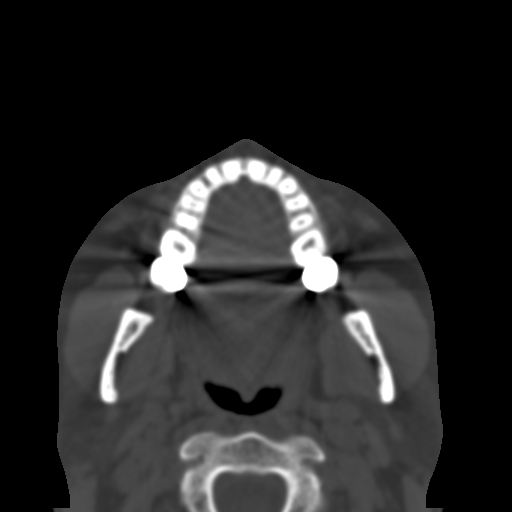
[im 10/48  bone]
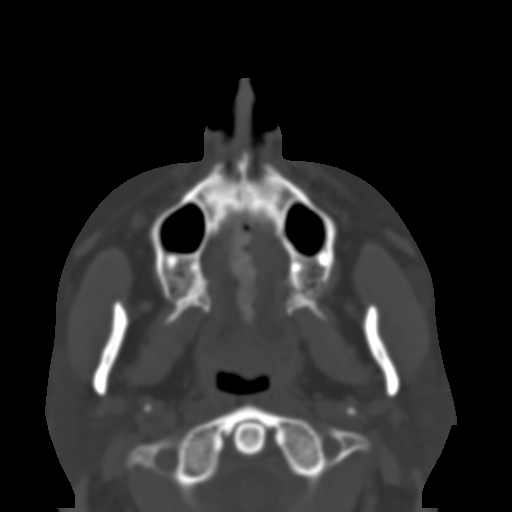
[im 15/48  bone]
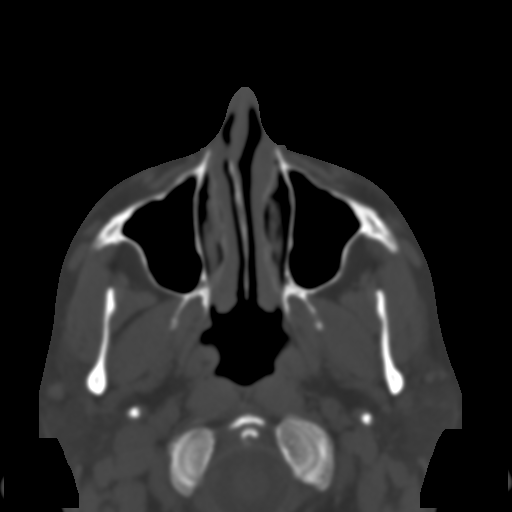
[im 22/48  bone]
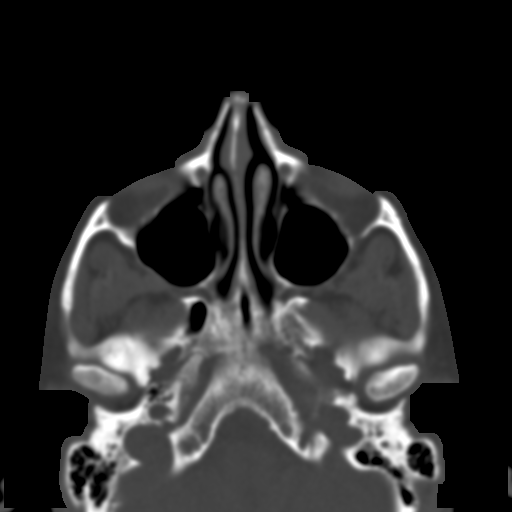
[im 26/48  brain]
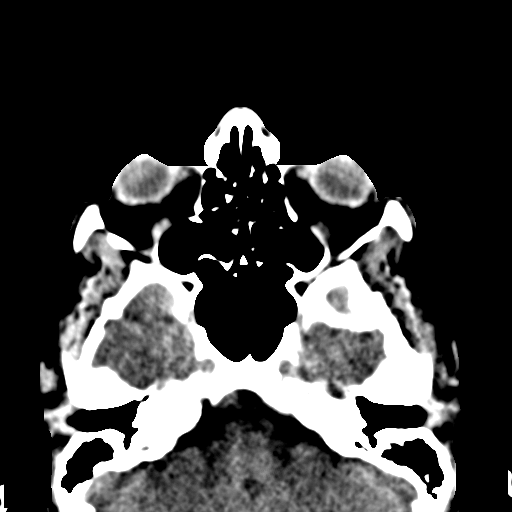
[im 26/48  bone]
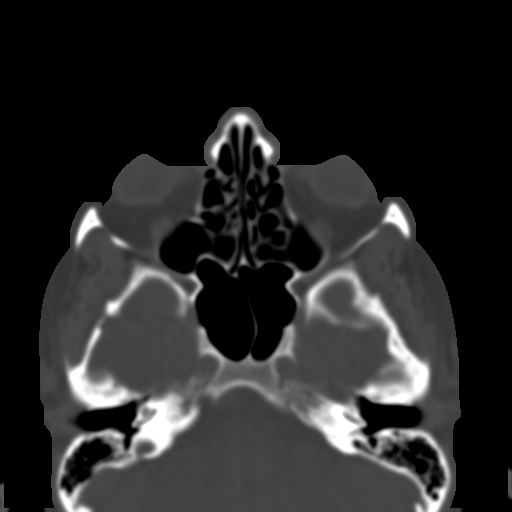
[im 33/48  bone]
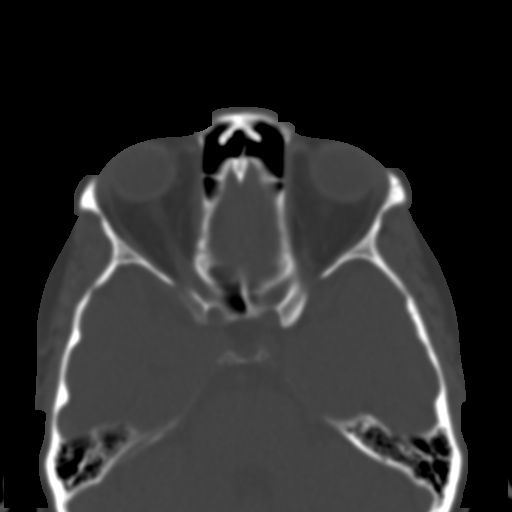
[im 38/48  bone]
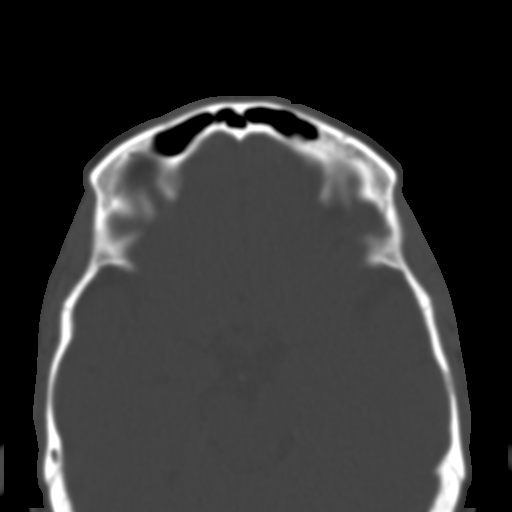
[im 44/48  bone]
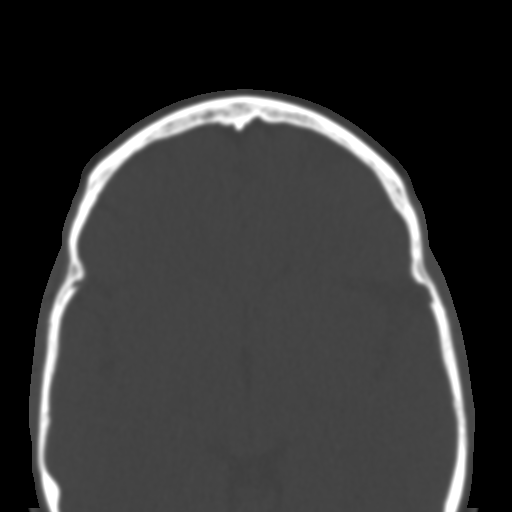

[Series 4: coronal soft · coronal · 0.19mm/px · 3 of 72 slices shown]
[im 24/72  bone]
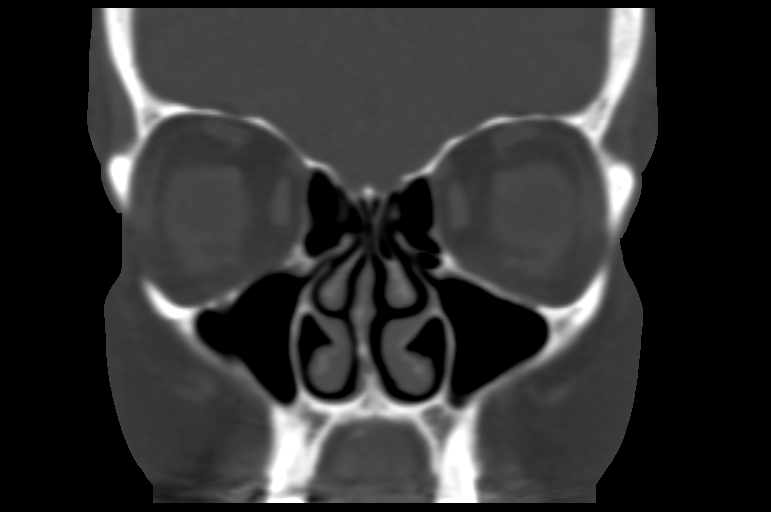
[im 32/72  bone]
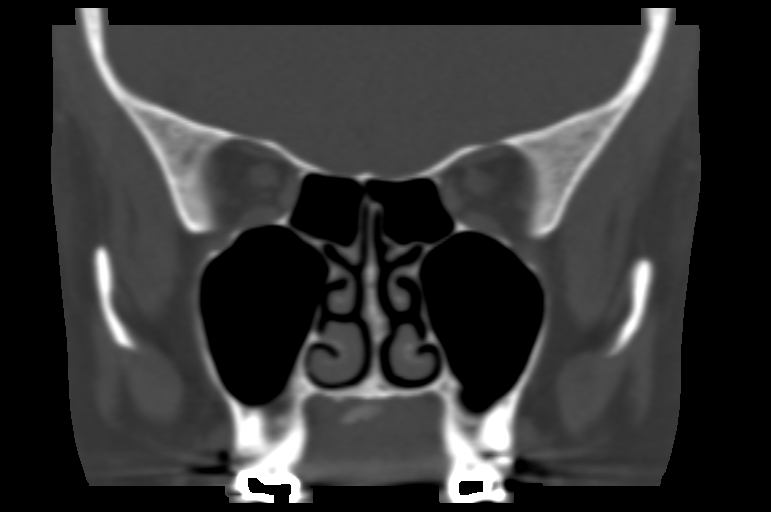
[im 40/72  bone]
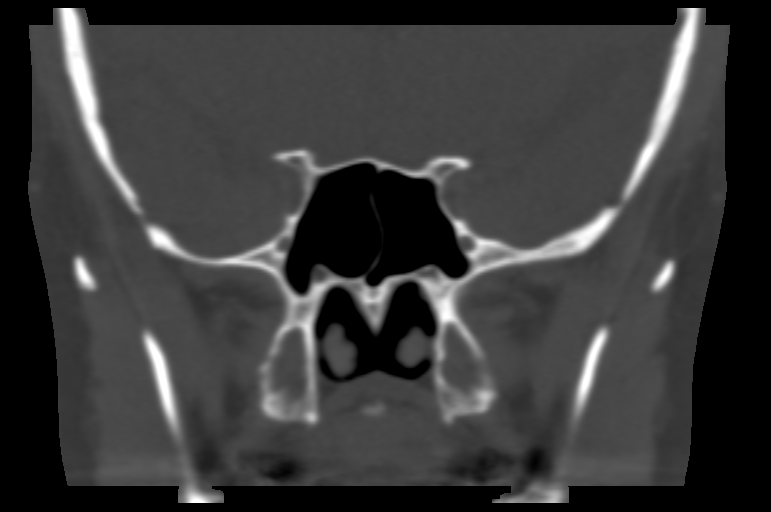

[Series 5: sagittal soft · sagittal · 0.20mm/px · 3 of 75 slices shown]
[im 25/75  bone]
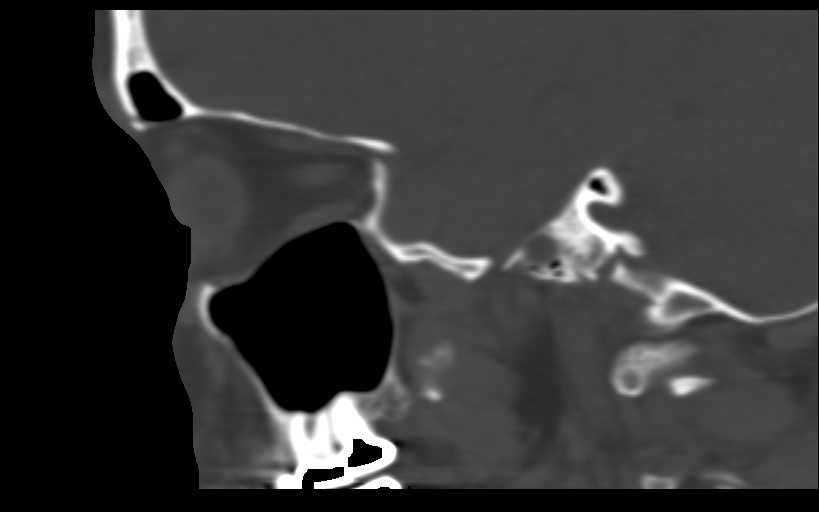
[im 38/75  bone]
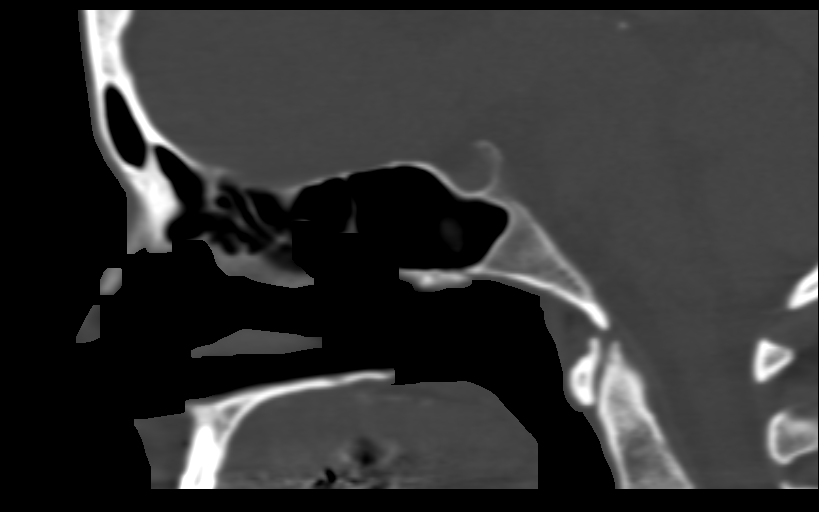
[im 50/75  bone]
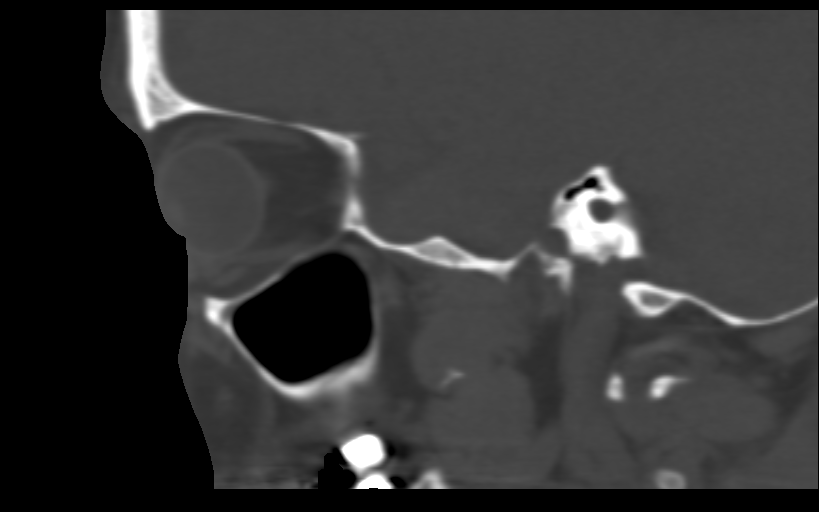

[14 of 47 positions shown; findings below may reference images not displayed]

FINDINGS: No fracture. No dislocation. No evidence of intraorbital mass. No
obvious thickening of the extraocular muscles or optic nerves.
Visualized sinuses are clear.
IMPRESSION: Within normal limits.

## 2017-05-12 DIAGNOSIS — Z01419 Encounter for gynecological examination (general) (routine) without abnormal findings: Secondary | ICD-10-CM | POA: Diagnosis not present

## 2017-05-12 DIAGNOSIS — Z1231 Encounter for screening mammogram for malignant neoplasm of breast: Secondary | ICD-10-CM | POA: Diagnosis not present

## 2017-05-12 DIAGNOSIS — B373 Candidiasis of vulva and vagina: Secondary | ICD-10-CM | POA: Diagnosis not present

## 2017-05-12 DIAGNOSIS — Z6826 Body mass index (BMI) 26.0-26.9, adult: Secondary | ICD-10-CM | POA: Diagnosis not present

## 2017-06-20 DIAGNOSIS — E119 Type 2 diabetes mellitus without complications: Secondary | ICD-10-CM | POA: Diagnosis not present

## 2017-06-20 LAB — HM DIABETES EYE EXAM

## 2017-10-06 ENCOUNTER — Ambulatory Visit (INDEPENDENT_AMBULATORY_CARE_PROVIDER_SITE_OTHER): Payer: BLUE CROSS/BLUE SHIELD | Admitting: Family Medicine

## 2017-10-06 VITALS — BP 120/80 | HR 73 | Temp 97.9°F | Resp 16 | Ht 61.0 in | Wt 140.0 lb

## 2017-10-06 DIAGNOSIS — R7303 Prediabetes: Secondary | ICD-10-CM

## 2017-10-06 DIAGNOSIS — Z0000 Encounter for general adult medical examination without abnormal findings: Secondary | ICD-10-CM

## 2017-10-06 DIAGNOSIS — I493 Ventricular premature depolarization: Secondary | ICD-10-CM

## 2017-10-06 DIAGNOSIS — F411 Generalized anxiety disorder: Secondary | ICD-10-CM

## 2017-10-06 DIAGNOSIS — F419 Anxiety disorder, unspecified: Secondary | ICD-10-CM

## 2017-10-06 DIAGNOSIS — E7800 Pure hypercholesterolemia, unspecified: Secondary | ICD-10-CM

## 2017-10-06 LAB — POCT URINALYSIS DIPSTICK
Bilirubin, UA: NEGATIVE
Glucose, UA: NEGATIVE
Ketones, UA: NEGATIVE
LEUKOCYTES UA: NEGATIVE
NITRITE UA: NEGATIVE
PH UA: 6.5 (ref 5.0–8.0)
PROTEIN UA: NEGATIVE
RBC UA: NEGATIVE
SPEC GRAV UA: 1.01 (ref 1.010–1.025)
UROBILINOGEN UA: 0.2 U/dL

## 2017-10-06 MED ORDER — HYDROCHLOROTHIAZIDE 12.5 MG PO CAPS: 12.5000 mg | ORAL_CAPSULE | Freq: Every day | ORAL | 3 refills | Status: DC

## 2017-10-06 MED ORDER — ALPRAZOLAM 0.5 MG PO TABS: 0.5000 mg | ORAL_TABLET | Freq: Two times a day (BID) | ORAL | 1 refills | Status: DC | PRN

## 2017-10-06 MED ORDER — BUPROPION HCL ER (XL) 300 MG PO TB24: 300.0000 mg | ORAL_TABLET | Freq: Every day | ORAL | 3 refills | Status: DC

## 2017-10-06 MED ORDER — ESCITALOPRAM OXALATE 5 MG PO TABS: 5.0000 mg | ORAL_TABLET | Freq: Every day | ORAL | 3 refills | Status: DC

## 2017-10-06 NOTE — Progress Notes (Signed)
[name], Female    DOB: [birth year], 47 y.o.   MRN: [id number] Visit Date: 10/06/2017  Today's Provider: Megan Mans, MD   Chief Complaint  Patient presents with  . Annual Exam   Subjective:  Cindy Duncan is a 47 y.o. female who presents today for health maintenance and complete physical. She feels well. She reports exercising 4-5 times weekly. She reports she is sleeping well. 17yo son recently diagnosed with Renal disease which may be hereditary from her father. 04/08/09 Colonoscopy, 07/24/13 Colonoscopy, [facility]-int hemorrhoids, repeat 10 years. Pap, Mammogram-Gyn in [facility]  Patient had labs done in March through her work, copy in chart.   Patient had Flu vaccine 2 weeks ago.  Review of Systems  Constitutional: Negative.   HENT: Negative.   Eyes: Negative.   Respiratory: Negative.   Cardiovascular: Negative.   Gastrointestinal: Negative.   Endocrine: Negative.   Genitourinary: Negative.   Musculoskeletal: Negative.   Skin: Negative.   Allergic/Immunologic: Negative.   Neurological: Negative.   Hematological: Negative.   Psychiatric/Behavioral: Negative.     Social History   Socioeconomic History  . Marital status: Legally Separated    Spouse name: Not on file  . Number of children: Not on file  . Years of education: Not on file  . Highest education level: Not on file  Occupational History  . Not on file  Social Needs  . Financial resource strain: Not on file  . Food insecurity:    Worry: Not on file    Inability: Not on file  . Transportation needs:    Medical: Not on file    Non-medical: Not on file  Tobacco Use  . Smoking status: Former Smoker    Packs/day: 1.00    Years: 8.00    Pack years: 8.00  . Smokeless tobacco: Never Used  Substance and Sexual Activity  . Alcohol use: No  . Drug use: No  . Sexual activity: Not on file  Lifestyle  . Physical activity:    Days per week: Not on file    Minutes per session: Not on file   . Stress: Not on file  Relationships  . Social connections:    Talks on phone: Not on file    Gets together: Not on file    Attends religious service: Not on file    Active member of club or organization: Not on file    Attends meetings of clubs or organizations: Not on file    Relationship status: Not on file  . Intimate partner violence:    Fear of current or ex partner: Not on file    Emotionally abused: Not on file    Physically abused: Not on file    Forced sexual activity: Not on file  Other Topics Concern  . Not on file  Social History Narrative  . Not on file    Patient Active Problem List   Diagnosis Date Noted  . Attention deficit disorder 11/05/2014  . Anxiety 11/05/2014  . Clinical depression 11/05/2014  . Gastro-esophageal reflux disease without esophagitis 11/05/2014  . Hypercholesterolemia 11/05/2014  . BP (high blood pressure) 11/05/2014  . Anemia, iron deficiency 11/05/2014  . Headache, migraine 11/05/2014  . Adiposity 11/05/2014  . Awareness of heartbeats 11/05/2014  . Panic attack 11/05/2014  . Need for prophylactic hormone replacement therapy (postmenopausal) 11/05/2014  . 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 Beat, premature ventricular 06/25/2013    Past Surgical History:  Procedure Laterality Date  . ABDOMINAL  HYSTERECTOMY     complete  . ABLATION     uterine ablation  . BUNIONECTOMY Bilateral   . CESAREAN SECTION    . KNEE SURGERY     torn miniscus repair  . TONSILLECTOMY AND ADENOIDECTOMY    . TUBAL LIGATION    . UPPER GI ENDOSCOPY  10/05/07   normal stomach, duodenum, gerd without esophagitis    Her family history includes COPD in her paternal grandfather; Congestive Heart Failure in her paternal grandfather; Dementia in her father; Hypertension in her father, paternal aunt, paternal grandfather, and paternal grandmother; Hypothyroidism in her mother; Kidney disease in her father; Muscular dystrophy in her maternal uncle; Prostate cancer in  her maternal grandfather; Renal Disease in her paternal grandmother; Stroke in her maternal grandmother.     Outpatient Encounter Medications as of 10/06/2017  Medication Sig Note  . ALPRAZolam (XANAX) 0.5 MG tablet Take 1 tablet (0.5 mg total) by mouth 2 (two) times daily as needed.   Marland Kitchen buPROPion (WELLBUTRIN XL) 300 MG 24 hr tablet Take 1 tablet (300 mg total) by mouth daily.   . calcium carbonate (TUMS) 500 MG chewable tablet Chew 2 tablets by mouth daily as needed for indigestion or heartburn.    . dexlansoprazole (DEXILANT) 60 MG capsule Take 60 mg by mouth daily.    Marland Kitchen docusate sodium (COLACE) 100 MG capsule Take 100 mg by mouth daily.   . Dulaglutide (TRULICITY) 0.75 MG/0.5ML SOPN Inject into the skin.   Marland Kitchen escitalopram (LEXAPRO) 5 MG tablet Take 1 tablet (5 mg total) by mouth daily.   Marland Kitchen estrogens, conjugated, (PREMARIN) 1.25 MG tablet Take 1.25 mg by mouth daily.    . famotidine (PEPCID AC) 10 MG tablet Take 10 mg by mouth daily.    Marland Kitchen Fe-Succ-C-Thre-B12-Des Stomach 70 MG TABS Take by mouth. 06/11/2015: Received from: [facility]  . FIBER SELECT GUMMIES CHEW Chew 2 Doses by mouth daily.    . hydrochlorothiazide (MICROZIDE) 12.5 MG capsule Take 1 capsule (12.5 mg total) by mouth daily.   . magnesium oxide (MAG-OX) 400 MG tablet Take 400 mg by mouth daily.    . metFORMIN (GLUCOPHAGE) 1000 MG tablet TAKE 1 TABLET TWICE DAILY WITH MEALS   . Multiple Vitamins-Iron (MULTI-VITAMIN/IRON PO) Take 1 tablet by mouth every other day. Reported on 06/11/2015   . [DISCONTINUED] VICTOZA 18 MG/3ML SOPN INJECT 1.2MCG UNDER THE SKIN DAILY    No facility-administered encounter medications on file as of 10/06/2017.     Patient Care Team: Maple Hudson., MD as PCP - General (Family Medicine)      Objective:   Vitals:  Vitals:   10/06/17 0853  BP: 120/80  Pulse: 73  Resp: 16  Temp: 97.9 F (36.6 C)  TempSrc: Oral  Weight: 140 lb (63.5 kg)  Height: 5\' 1"  (1.549 m)     Physical Exam  Constitutional: She is oriented to person, place, and time. She appears well-developed and well-nourished.  HENT:  Head: Normocephalic and atraumatic.  Right Ear: External ear normal.  Left Ear: External ear normal.  Nose: Nose normal.  Mouth/Throat: Oropharynx is clear and moist.  Eyes: Pupils are equal, round, and reactive to light. Conjunctivae and EOM are normal.  Neck: Normal range of motion. Neck supple.  Cardiovascular: Normal rate, regular rhythm, normal heart sounds and intact distal pulses.  Pulmonary/Chest: Effort normal and breath sounds normal.  Abdominal: Soft. Bowel sounds are normal.  Musculoskeletal: Normal range of motion.  Neurological: She is alert  and oriented to person, place, and time.  Skin: Skin is warm and dry.  Psychiatric: She has a normal mood and affect. Her behavior is normal. Judgment and thought content normal.     Depression Screen PHQ 2/9 Scores 10/06/2017 10/05/2016 06/11/2015  PHQ - 2 Score 0 0 0  PHQ- 9 Score - 0 -      Assessment & Plan:     Routine Health Maintenance and Physical Exam  Exercise Activities and Dietary recommendations Goals   None     Immunization History  Administered Date(s) Administered  . Influenza,inj,Quad PF,6+ Mos 10/05/2016  . Influenza-Unspecified 09/19/2017    Health Maintenance  Topic Date Due  . HIV Screening  04/11/1984  . TETANUS/TDAP  04/11/1988  . INFLUENZA VACCINE  08/04/2017  . PAP SMEAR  Discontinued    Has Gynecologist. Discussed health benefits of physical activity, and encouraged her to engage in regular exercise appropriate for her age and condition.   - ALPRAZolam (XANAX) 0.5 MG tablet; Take 1 tablet (0.5 mg total) by mouth 2 (two) times daily as needed.  Dispense: 180 tablet; Refill: 1  2. Annual physical exam  - POCT urinalysis dipstick  3. Beat, premature ventricular   4. Chemical diabetes   5. Hypercholesterolemia   6. Anxiety Refills given for  all above issues.  I have done the exam and reviewed the chart and it is accurate to the best of my knowledge. Dentist has been used and  any errors in dictation or transcription are unintentional. Julieanne Manson M.D. [facility]

## 2018-03-27 DIAGNOSIS — M5030 Other cervical disc degeneration, unspecified cervical region: Secondary | ICD-10-CM | POA: Diagnosis not present

## 2018-03-27 DIAGNOSIS — M546 Pain in thoracic spine: Secondary | ICD-10-CM | POA: Diagnosis not present

## 2018-08-10 DIAGNOSIS — Z01419 Encounter for gynecological examination (general) (routine) without abnormal findings: Secondary | ICD-10-CM | POA: Diagnosis not present

## 2018-08-10 DIAGNOSIS — Z1231 Encounter for screening mammogram for malignant neoplasm of breast: Secondary | ICD-10-CM | POA: Diagnosis not present

## 2018-08-10 DIAGNOSIS — Z6826 Body mass index (BMI) 26.0-26.9, adult: Secondary | ICD-10-CM | POA: Diagnosis not present

## 2018-08-14 ENCOUNTER — Other Ambulatory Visit: Payer: Self-pay | Admitting: Obstetrics & Gynecology

## 2018-08-14 DIAGNOSIS — R928 Other abnormal and inconclusive findings on diagnostic imaging of breast: Secondary | ICD-10-CM

## 2018-08-16 ENCOUNTER — Ambulatory Visit
Admission: RE | Admit: 2018-08-16 | Discharge: 2018-08-16 | Disposition: A | Discharge status: Home / Self Care | Payer: BLUE CROSS/BLUE SHIELD | Source: Ambulatory Visit | Attending: Obstetrics & Gynecology | Admitting: Obstetrics & Gynecology

## 2018-08-16 DIAGNOSIS — N6489 Other specified disorders of breast: Secondary | ICD-10-CM | POA: Diagnosis not present

## 2018-08-16 DIAGNOSIS — R928 Other abnormal and inconclusive findings on diagnostic imaging of breast: Secondary | ICD-10-CM

## 2018-09-04 IMAGING — CR DG KNEE COMPLETE 4+V*R*
1 series · 4 of 4 positions shown · non-contrast
Comparison: None.

CLINICAL DATA: Initial evaluation for acute bilateral knee pain.

EXAM:
RIGHT KNEE - COMPLETE 4+ VIEW

[Series 1: dg knee complete 4 views right · 0.14mm/px · 4 of 4 slices shown]
[im 1/4]
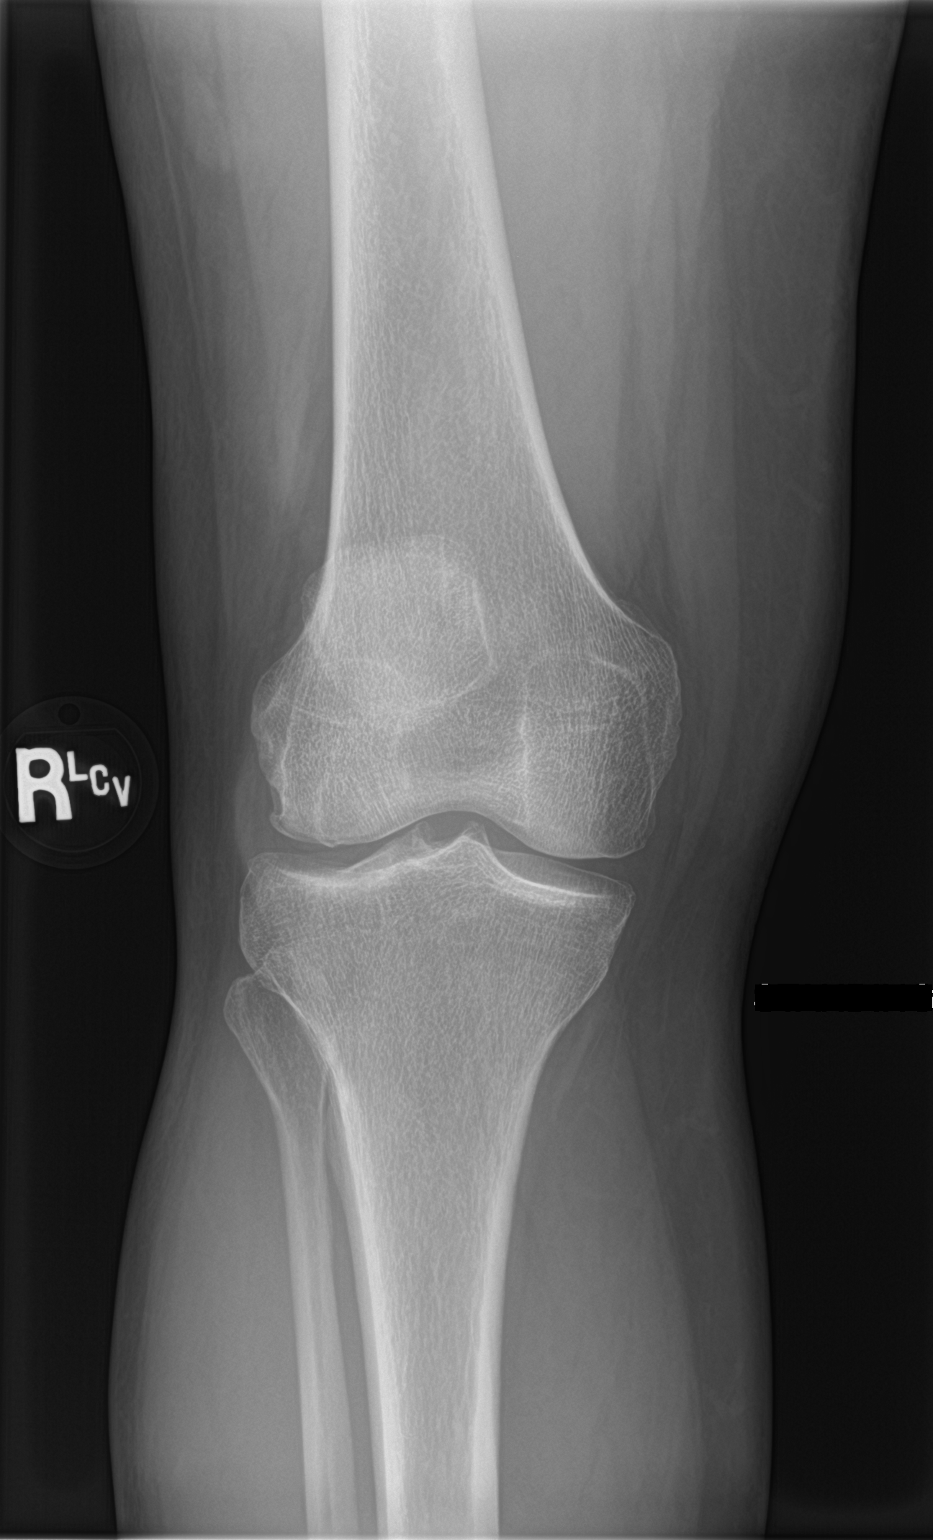
[im 2/4]
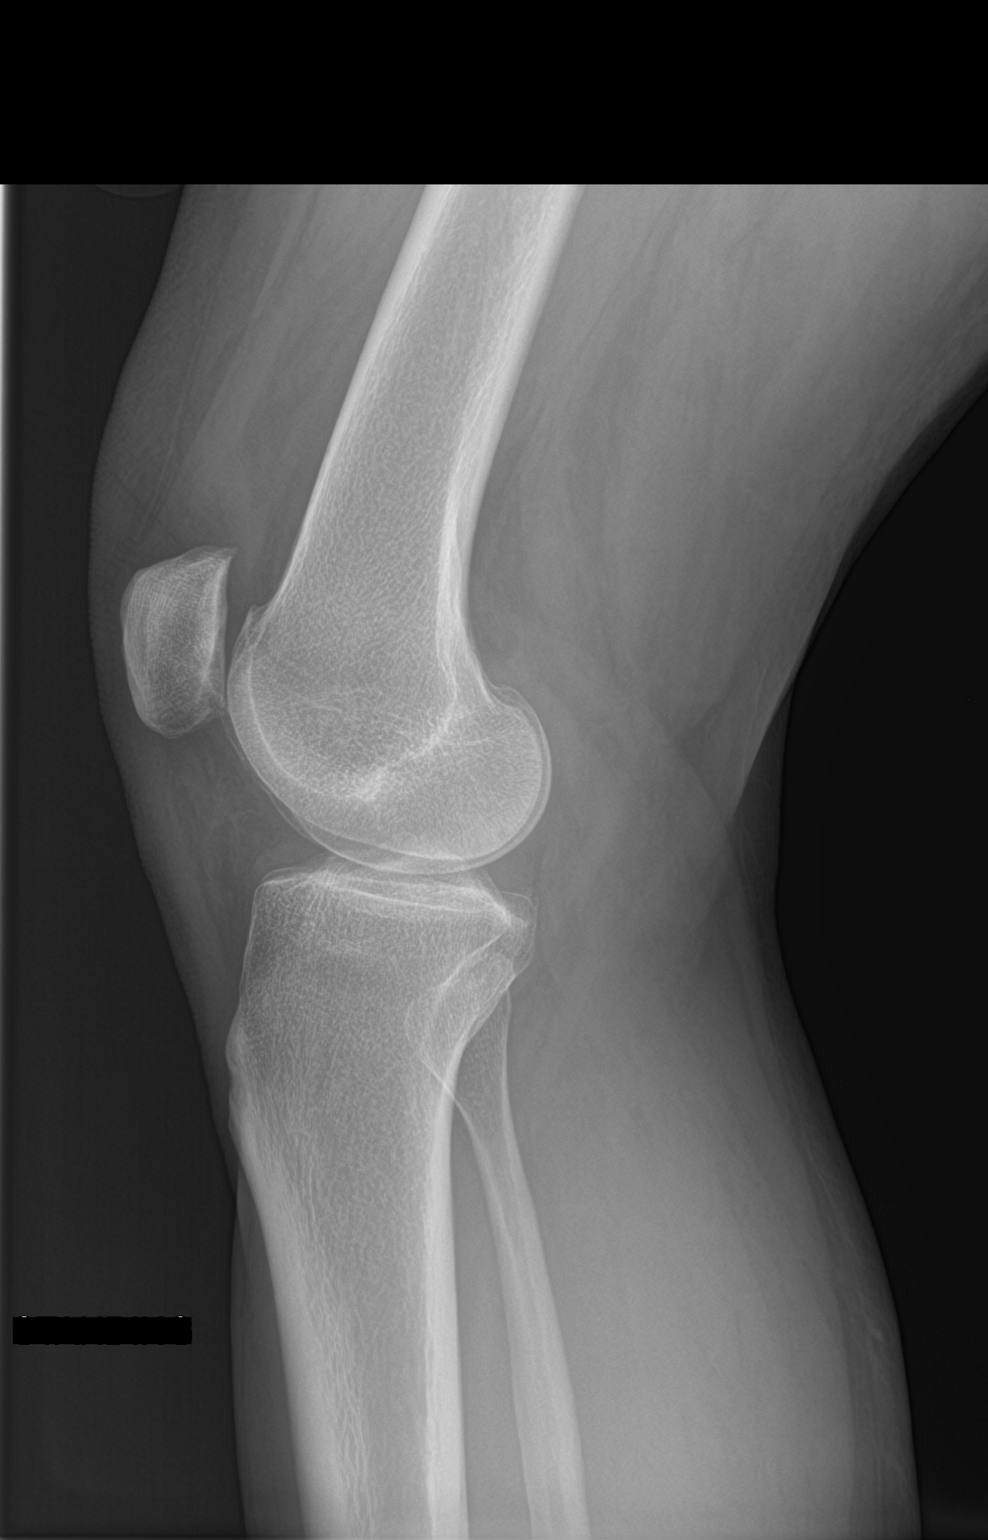
[im 3/4]
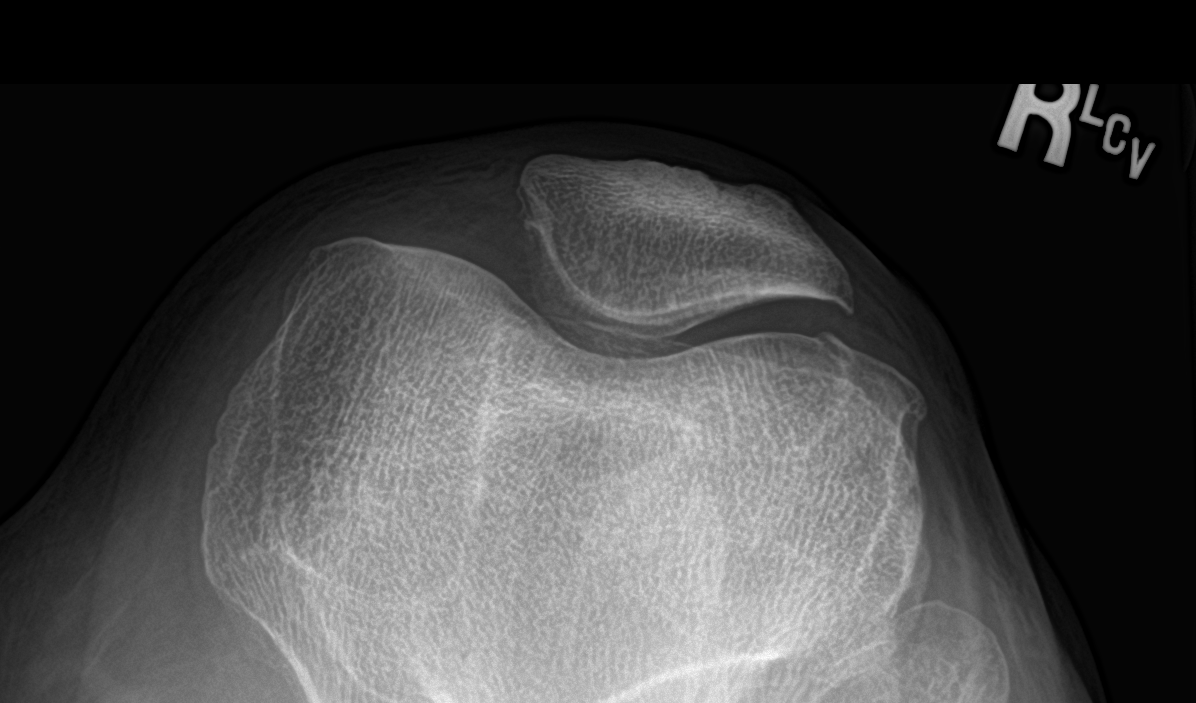
[im 4/4]
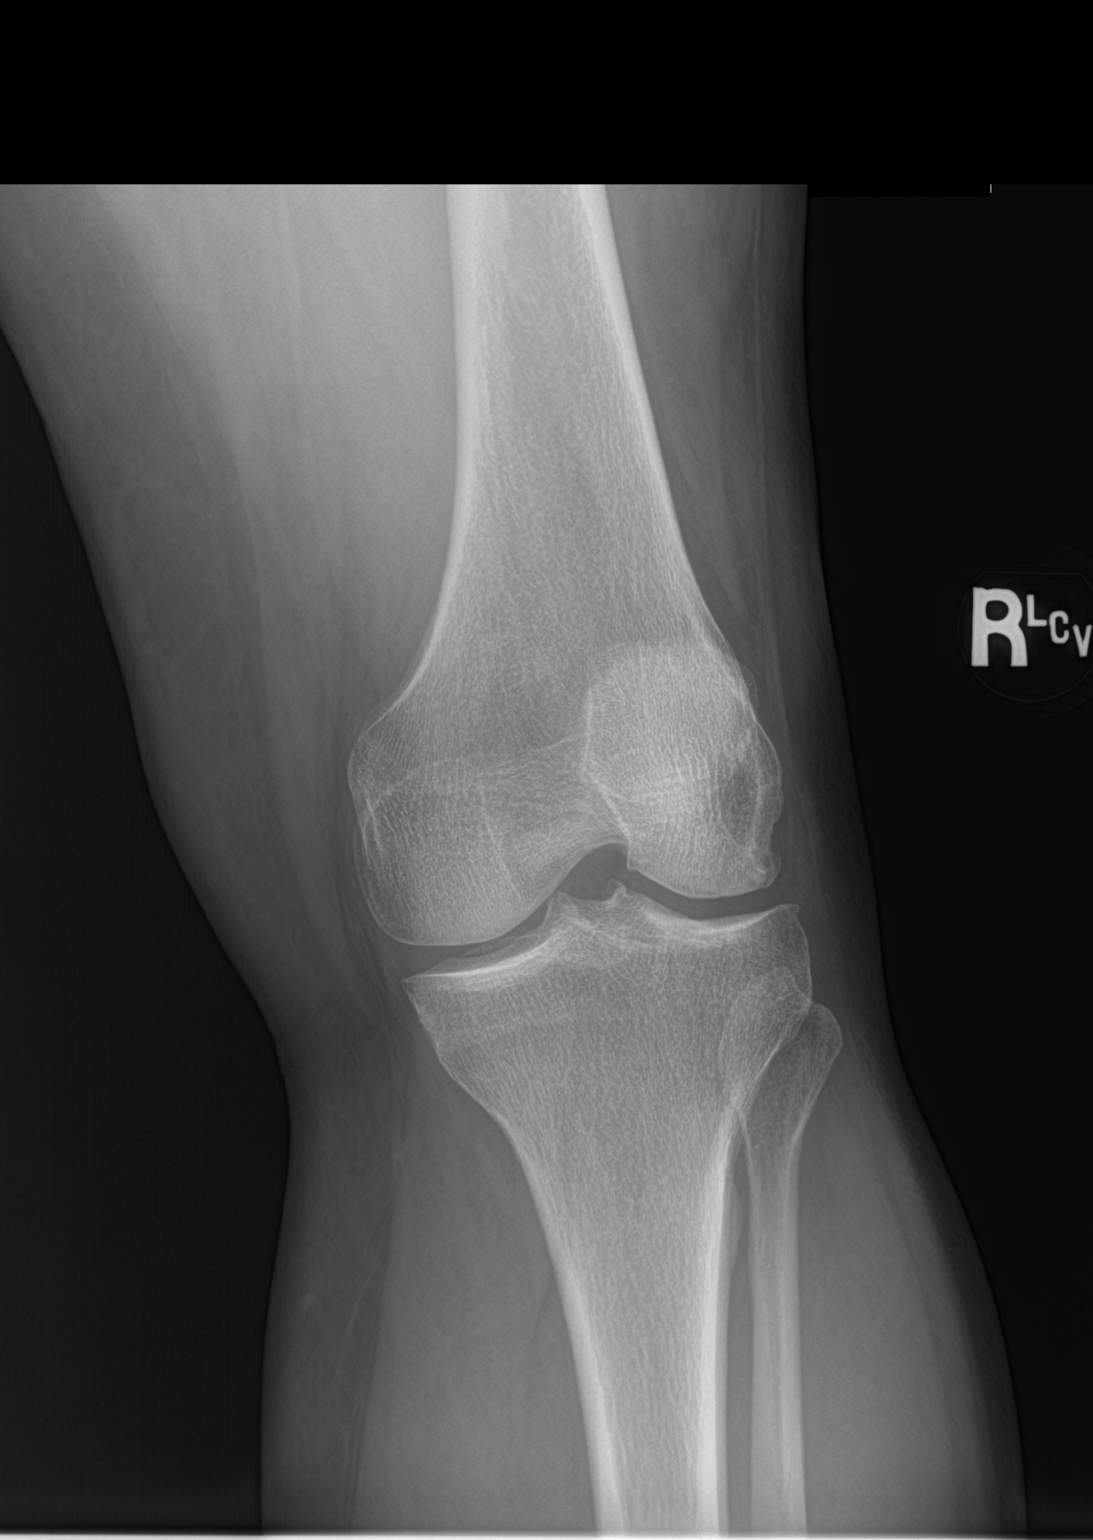

[4 of 4 positions shown; findings below may reference images not displayed]

FINDINGS: No acute fracture or dislocation. Trace joint effusion noted within
the suprapatellar recess. Mild to moderate tricompartmental
degenerative osteoarthrosis. Osseous mineralization normal. No soft
tissue abnormality.
IMPRESSION: 1. No acute osseous abnormality about the right knee.
2. Trace joint effusion within the suprapatellar recess.
3. Mild to moderate tricompartmental degenerative osteoarthrosis.

## 2018-09-04 IMAGING — CR DG KNEE COMPLETE 4+V*L*
1 series · 4 of 4 positions shown · non-contrast
Comparison: None.

CLINICAL DATA: Initial evaluation for acute bilateral knee pain.

EXAM:
LEFT KNEE - COMPLETE 4+ VIEW

[Series 1: dg knee complete 4 views left · 0.14mm/px · 4 of 4 slices shown]
[im 1/4]
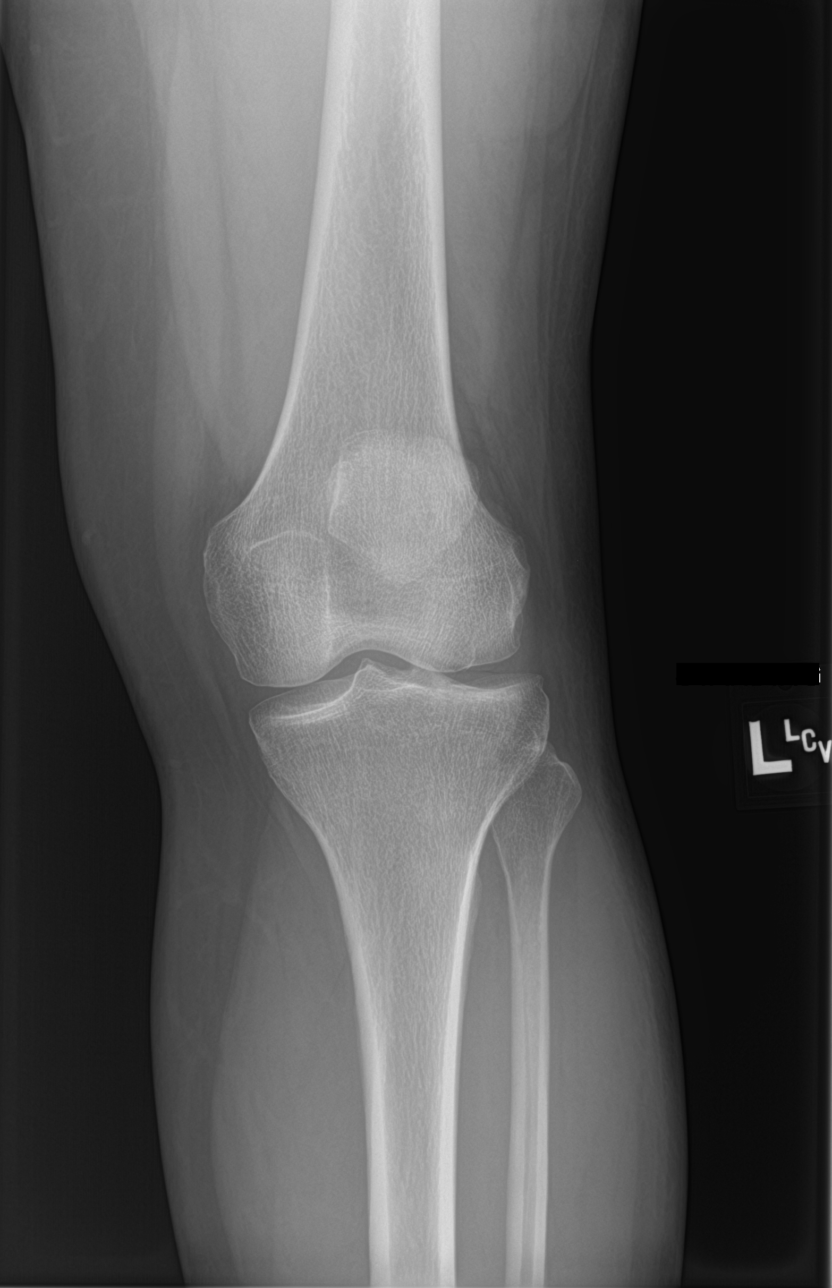
[im 2/4]
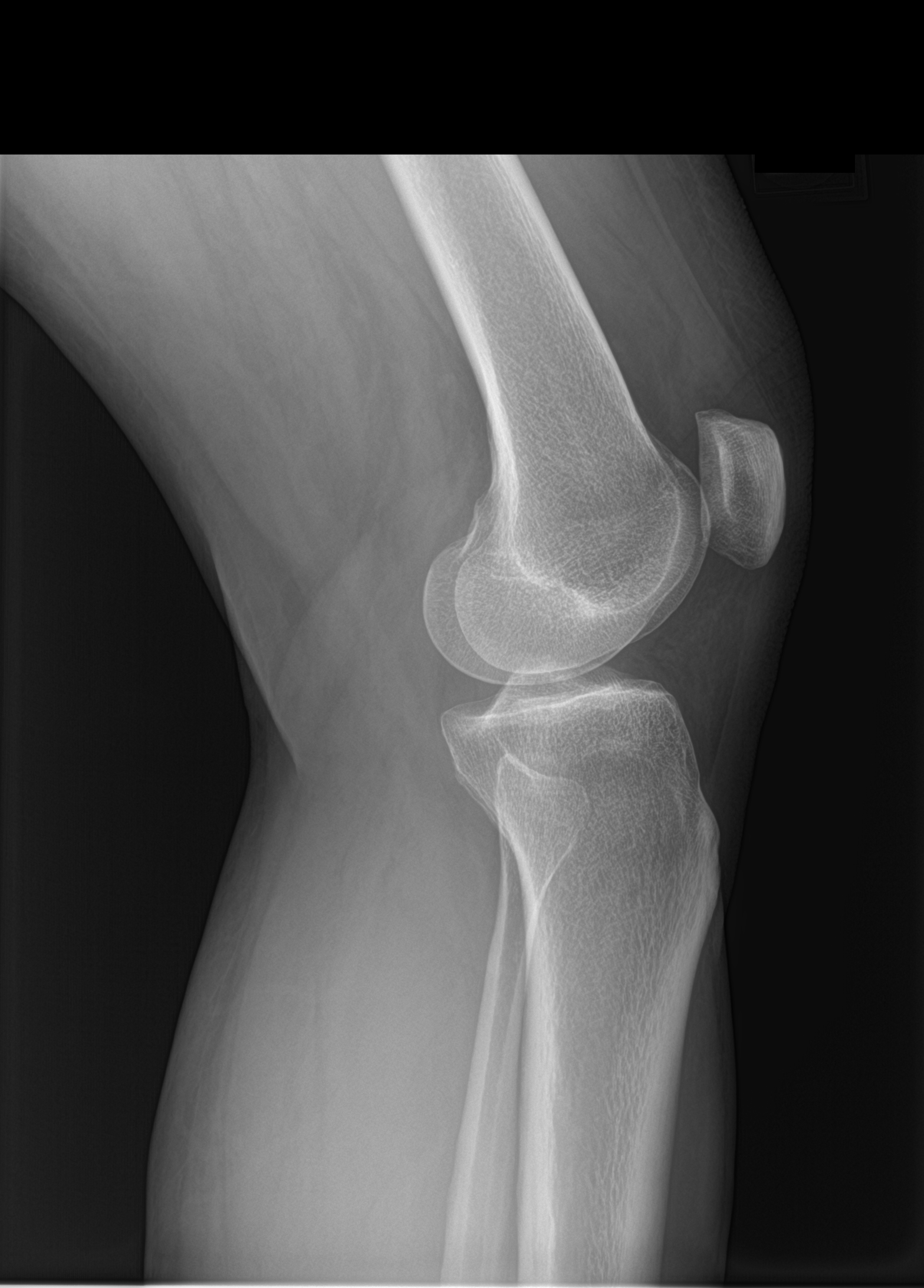
[im 3/4]
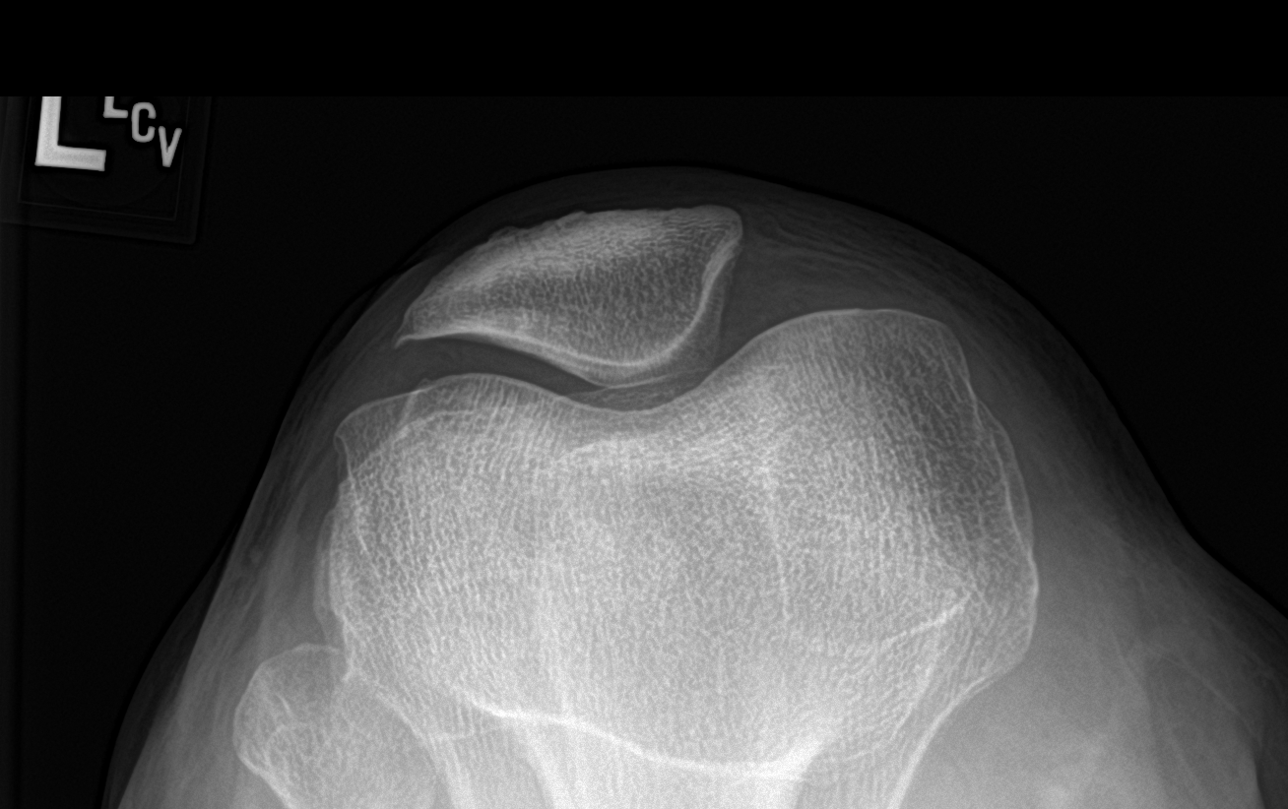
[im 4/4]
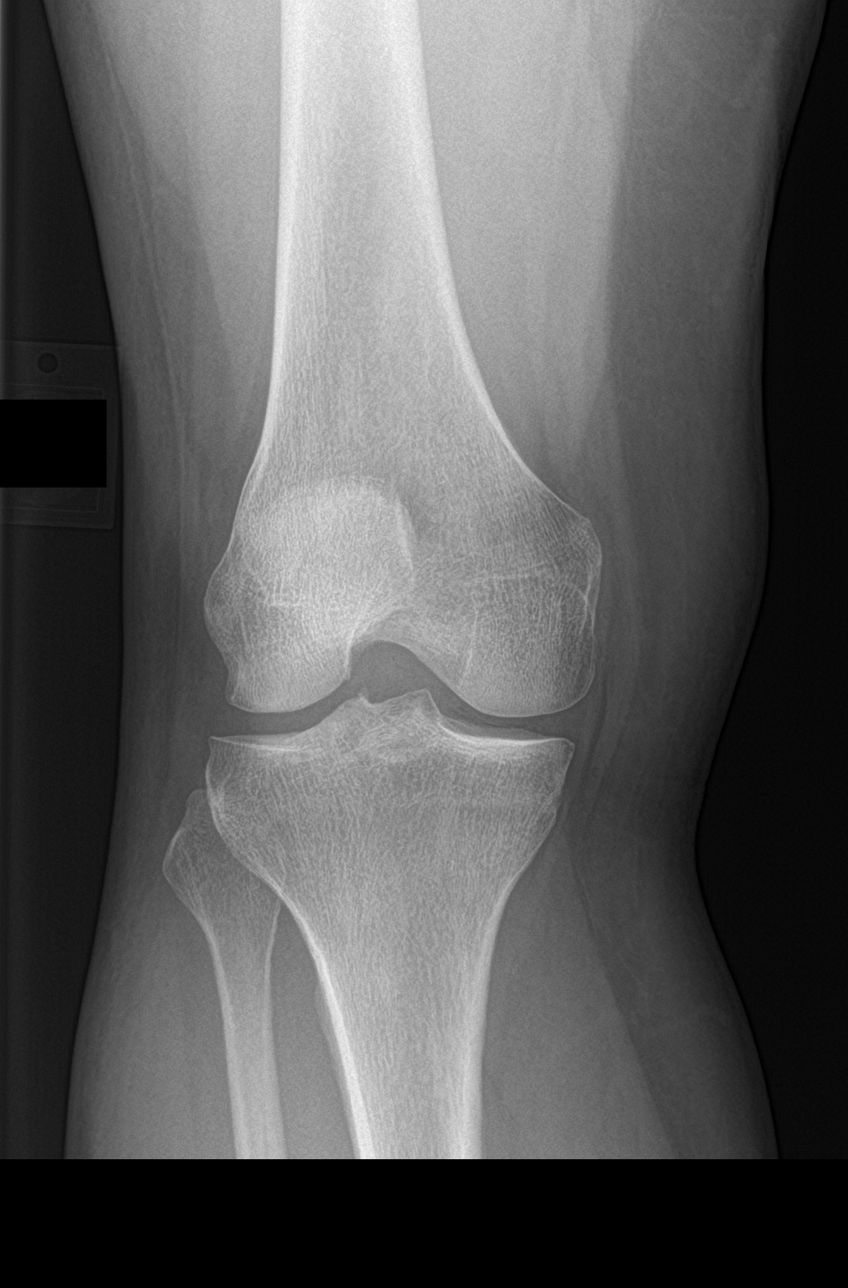

[4 of 4 positions shown; findings below may reference images not displayed]

FINDINGS: No acute fracture or dislocation. No joint effusion. Mild
tricompartmental degenerative osteoarthritic changes, most notable
at the patellofemoral joint. Faint chondrocalcinosis suspected at
the medial femorotibial joint space compartment. Osseous
mineralization normal. No soft tissue abnormality.
IMPRESSION: 1. No acute osseous abnormality about the knee.
2. Mild tricompartmental degenerative osteoarthrosis, greatest at
the patellofemoral articulation.

## 2018-10-12 ENCOUNTER — Other Ambulatory Visit: Payer: Self-pay

## 2018-10-12 ENCOUNTER — Ambulatory Visit (INDEPENDENT_AMBULATORY_CARE_PROVIDER_SITE_OTHER): Payer: BC Managed Care – PPO | Admitting: Family Medicine

## 2018-10-12 ENCOUNTER — Encounter: Payer: Self-pay | Admitting: Family Medicine

## 2018-10-12 VITALS — BP 110/78 | HR 77 | Temp 97.8°F | Resp 18 | Wt 139.8 lb

## 2018-10-12 DIAGNOSIS — F411 Generalized anxiety disorder: Secondary | ICD-10-CM

## 2018-10-12 DIAGNOSIS — R319 Hematuria, unspecified: Secondary | ICD-10-CM | POA: Diagnosis not present

## 2018-10-12 DIAGNOSIS — Z0000 Encounter for general adult medical examination without abnormal findings: Secondary | ICD-10-CM

## 2018-10-12 LAB — POCT URINALYSIS DIPSTICK
Bilirubin, UA: NEGATIVE
Glucose, UA: NEGATIVE
Leukocytes, UA: NEGATIVE
Nitrite, UA: NEGATIVE
Protein, UA: POSITIVE — AB
Spec Grav, UA: 1.01 (ref 1.010–1.025)
Urobilinogen, UA: 0.2 E.U./dL
pH, UA: 6 (ref 5.0–8.0)

## 2018-10-12 MED ORDER — BUPROPION HCL ER (XL) 300 MG PO TB24: 300.0000 mg | ORAL_TABLET | Freq: Every day | ORAL | 3 refills | Status: DC

## 2018-10-12 MED ORDER — ALPRAZOLAM 0.5 MG PO TABS: 0.5000 mg | ORAL_TABLET | Freq: Two times a day (BID) | ORAL | 1 refills | Status: DC | PRN

## 2018-10-12 MED ORDER — ESCITALOPRAM OXALATE 5 MG PO TABS: 5.0000 mg | ORAL_TABLET | Freq: Every day | ORAL | 3 refills | Status: DC

## 2018-10-12 NOTE — Progress Notes (Signed)
[name], Female    DOB: 05-14-1970, 48 y.o.   MRN: [id number] Visit Date: 10/12/2018  Today's Provider: Megan Mans, MD   Chief Complaint  Patient presents with  . Annual Exam   Subjective:     [name] is a 48 y.o. female who presents today for health maintenance and complete physical. She feels well. She reports exercising by weight lifting, circuit training, and play tennis 3 to 5 days a week. She reports she is sleeping well. Medication refill on Wellbutrin, Lexapro, and Xanax 90 day supply.  Emotionally she has been stable for years. She still sees Dr. Konrad Dolores for GYN matters.  She exercises multiple times per week now. -----------------------------------------------------------------   Review of Systems  Constitutional: Negative.   HENT: Negative.   Eyes: Negative.   Respiratory: Negative.   Cardiovascular: Negative.   Gastrointestinal: Negative.   Endocrine: Negative.   Genitourinary: Negative.   Musculoskeletal: Negative.   Skin: Negative.   Allergic/Immunologic: Negative.   Neurological: Negative.   Hematological: Negative.   Psychiatric/Behavioral: Negative.     Social History      She  reports that she has quit smoking. She has a 8.00 pack-year smoking history. She has never used smokeless tobacco. She reports that she does not drink alcohol or use drugs.       Social History   Socioeconomic History  . Marital status: Legally Separated    Spouse name: Not on file  . Number of children: Not on file  . Years of education: Not on file  . Highest education level: Not on file  Occupational History  . Not on file  Social Needs  . Financial resource strain: Not on file  . Food insecurity    Worry: Not on file    Inability: Not on file  . Transportation needs    Medical: Not on file    Non-medical: Not on file  Tobacco Use  . Smoking status: Former Smoker    Packs/day: 1.00    Years: 8.00    Pack  years: 8.00  . Smokeless tobacco: Never Used  Substance and Sexual Activity  . Alcohol use: No  . Drug use: No  . Sexual activity: Not on file  Lifestyle  . Physical activity    Days per week: Not on file    Minutes per session: Not on file  . Stress: Not on file  Relationships  . Social Musician on phone: Not on file    Gets together: Not on file    Attends religious service: Not on file    Active member of club or organization: Not on file    Attends meetings of clubs or organizations: Not on file    Relationship status: Not on file  Other Topics Concern  . Not on file  Social History Narrative  . Not on file    Past Medical History:  Diagnosis Date  . Depression   . Diabetes mellitus without complication (HCC)   . GERD (gastroesophageal reflux disease)   . Hypertension      Patient Active Problem List   Diagnosis Date Noted  . Attention deficit disorder 11/05/2014  . Anxiety 11/05/2014  . Clinical depression 11/05/2014  . Gastro-esophageal reflux disease without esophagitis 11/05/2014  . Hypercholesterolemia 11/05/2014  . BP (high blood pressure) 11/05/2014  . Anemia, iron deficiency 11/05/2014  . Headache, migraine 11/05/2014  . Adiposity 11/05/2014  .  Awareness of heartbeats 11/05/2014  . Panic attack 11/05/2014  . Need for prophylactic hormone replacement therapy (postmenopausal) 11/05/2014  . 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 Beat, premature ventricular 06/25/2013    Past Surgical History:  Procedure Laterality Date  . ABDOMINAL HYSTERECTOMY     complete  . ABLATION     uterine ablation  . BUNIONECTOMY Bilateral   . CESAREAN SECTION    . KNEE SURGERY     torn miniscus repair  . TONSILLECTOMY AND ADENOIDECTOMY    . TUBAL LIGATION    . UPPER GI ENDOSCOPY  10/05/07   normal stomach, duodenum, gerd without esophagitis    Family History        Family Status  Relation Name Status  . Mother  Alive  . Son  Alive  . MGM  Deceased  .  MGF  Deceased  . PGM  Deceased  . PGF  Deceased  . Father  (Not Specified)  . Mat Uncle  (Not Specified)  . Emelda BrothersPat Aunt  (Not Specified)        Her family history includes COPD in her paternal grandfather; Congestive Heart Failure in her paternal grandfather; Dementia in her father; Hypertension in her father, paternal aunt, paternal grandfather, and paternal grandmother; Hypothyroidism in her mother; Kidney disease in her father; Muscular dystrophy in her maternal uncle; Prostate cancer in her maternal grandfather; Renal Disease in her paternal grandmother; Stroke in her maternal grandmother.      No Known Allergies   Current Outpatient Medications:  .  ALPRAZolam (XANAX) 0.5 MG tablet, Take 1 tablet (0.5 mg total) by mouth 2 (two) times daily as needed., Disp: 180 tablet, Rfl: 1 .  buPROPion (WELLBUTRIN XL) 300 MG 24 hr tablet, Take 1 tablet (300 mg total) by mouth daily., Disp: 90 tablet, Rfl: 3 .  calcium carbonate (TUMS) 500 MG chewable tablet, Chew 2 tablets by mouth daily as needed for indigestion or heartburn. , Disp: , Rfl:  .  dexlansoprazole (DEXILANT) 60 MG capsule, Take 60 mg by mouth daily. , Disp: , Rfl:  .  docusate sodium (COLACE) 100 MG capsule, Take 100 mg by mouth daily., Disp: , Rfl:  .  Dulaglutide (TRULICITY) 0.75 MG/0.5ML SOPN, Inject into the skin., Disp: , Rfl:  .  escitalopram (LEXAPRO) 5 MG tablet, Take 1 tablet (5 mg total) by mouth daily., Disp: 90 tablet, Rfl: 3 .  estrogens, conjugated, (PREMARIN) 1.25 MG tablet, Take 1.25 mg by mouth daily. , Disp: , Rfl:  .  famotidine (PEPCID AC) 10 MG tablet, Take 10 mg by mouth daily. , Disp: , Rfl:  .  FIBER SELECT GUMMIES CHEW, Chew 2 Doses by mouth daily. , Disp: , Rfl:  .  hydrochlorothiazide (MICROZIDE) 12.5 MG capsule, Take 1 capsule (12.5 mg total) by mouth daily., Disp: 90 capsule, Rfl: 3 .  magnesium oxide (MAG-OX) 400 MG tablet, Take 400 mg by mouth daily. , Disp: , Rfl:  .  metFORMIN (GLUCOPHAGE) 1000 MG tablet,  TAKE 1 TABLET TWICE DAILY WITH MEALS, Disp: 180 tablet, Rfl: 0 .  Multiple Vitamins-Iron (MULTI-VITAMIN/IRON PO), Take 1 tablet by mouth every other day. Reported on 06/11/2015, Disp: , Rfl:  .  Fe-Succ-C-Thre-B12-Des Stomach 70 MG TABS, Take by mouth., Disp: , Rfl:    Patient Care Team: Maple HudsonGilbert, Leonid Manus L Jr., MD as PCP - General (Family Medicine)    Objective:    Vitals: BP 110/78 (BP Location: Left Arm, Patient Position: Sitting, Cuff Size: Normal)   Pulse 77  Temp 97.8 F (36.6 C) (Temporal)   Resp 18   Wt 139 lb 12.8 oz (63.4 kg)   SpO2 97%   BMI 26.41 kg/m    Vitals:   10/12/18 0907  BP: 110/78  Pulse: 77  Resp: 18  Temp: 97.8 F (36.6 C)  TempSrc: Temporal  SpO2: 97%  Weight: 139 lb 12.8 oz (63.4 kg)     Physical Exam Vitals signs reviewed.  Constitutional:      Appearance: She is well-developed.  HENT:     Head: Normocephalic and atraumatic.     Right Ear: External ear normal.     Left Ear: External ear normal.     Nose: Nose normal.  Eyes:     Conjunctiva/sclera: Conjunctivae normal.     Pupils: Pupils are equal, round, and reactive to light.  Neck:     Musculoskeletal: Normal range of motion and neck supple.  Cardiovascular:     Rate and Rhythm: Normal rate and regular rhythm.     Heart sounds: Normal heart sounds.  Pulmonary:     Effort: Pulmonary effort is normal.     Breath sounds: Normal breath sounds.  Abdominal:     General: Bowel sounds are normal.     Palpations: Abdomen is soft.  Musculoskeletal: Normal range of motion.  Skin:    General: Skin is warm and dry.  Neurological:     Mental Status: She is alert and oriented to person, place, and time.  Psychiatric:        Behavior: Behavior normal.        Thought Content: Thought content normal.        Judgment: Judgment normal.      Depression Screen PHQ 2/9 Scores 10/12/2018 10/06/2017 10/05/2016 06/11/2015  PHQ - 2 Score 0 0 0 0  PHQ- 9 Score 0 - 0 -       Assessment & Plan:      Routine Health Maintenance and Physical Exam  Exercise Activities and Dietary recommendations Goals   None     Immunization History  Administered Date(s) Administered  . Influenza,inj,Quad PF,6+ Mos 10/05/2016  . Influenza-Unspecified 09/19/2017    Health Maintenance  Topic Date Due  . HIV Screening  04/11/1984  . TETANUS/TDAP  04/11/1988  . INFLUENZA VACCINE  08/05/2018  . PAP SMEAR-Modifier  Discontinued     Discussed health benefits of physical activity, and encouraged her to engage in regular exercise appropriate for her age and condition.   1. Annual physical exam Return to clinic 1 year.  GYN by Dr. Dory Horn - POCT urinalysis dipstick  2. GAD (generalized anxiety disorder) Controlled on Lexapro and Wellbutrin and Xanax.  She is followed by psychiatry.  --------------------------------------------------------------------    Wilhemena Durie, MD  [facility]

## 2018-10-14 LAB — URINE CULTURE

## 2018-11-01 DIAGNOSIS — E119 Type 2 diabetes mellitus without complications: Secondary | ICD-10-CM | POA: Diagnosis not present

## 2018-11-01 LAB — HM DIABETES EYE EXAM

## 2018-12-06 ENCOUNTER — Other Ambulatory Visit: Payer: Self-pay | Admitting: Family Medicine

## 2018-12-25 ENCOUNTER — Encounter: Payer: Self-pay | Admitting: Podiatry

## 2018-12-25 ENCOUNTER — Other Ambulatory Visit: Payer: Self-pay | Admitting: Podiatry

## 2018-12-25 ENCOUNTER — Other Ambulatory Visit: Payer: Self-pay

## 2018-12-25 ENCOUNTER — Ambulatory Visit (INDEPENDENT_AMBULATORY_CARE_PROVIDER_SITE_OTHER): Payer: BC Managed Care – PPO

## 2018-12-25 ENCOUNTER — Ambulatory Visit: Payer: BC Managed Care – PPO | Admitting: Podiatry

## 2018-12-25 DIAGNOSIS — G5782 Other specified mononeuropathies of left lower limb: Secondary | ICD-10-CM

## 2018-12-25 DIAGNOSIS — G5762 Lesion of plantar nerve, left lower limb: Secondary | ICD-10-CM | POA: Diagnosis not present

## 2018-12-25 DIAGNOSIS — M778 Other enthesopathies, not elsewhere classified: Secondary | ICD-10-CM

## 2018-12-25 NOTE — Progress Notes (Signed)
Subjective:  Patient ID: Cindy Duncan, female    DOB: 06-30-1970,  MRN: [id number] HPI Chief Complaint  Patient presents with  . Foot Pain    Patient presents today for burning pains and numbness in 3rd, 4th toes left foot x 3-4 weeks.  She reports its worse with walking or running.  She has been taking Ibuprofen for relief    48 y.o. female presents with the above complaint.   ROS: Denies fever chills nausea vomiting muscle aches pains calf pain back pain chest pain shortness of breath.  Past Medical History:  Diagnosis Date  . Depression   . Diabetes mellitus without complication ([place])   . GERD (gastroesophageal reflux disease)   . Hypertension    Past Surgical History:  Procedure Laterality Date  . ABDOMINAL HYSTERECTOMY     complete  . ABLATION     uterine ablation  . BUNIONECTOMY Bilateral   . CESAREAN SECTION    . KNEE SURGERY     torn miniscus repair  . TONSILLECTOMY AND ADENOIDECTOMY    . TUBAL LIGATION    . UPPER GI ENDOSCOPY  10/05/07   normal stomach, duodenum, gerd without esophagitis    Current Outpatient Medications:  .  ALPRAZolam (XANAX) 0.5 MG tablet, Take 1 tablet (0.5 mg total) by mouth 2 (two) times daily as needed., Disp: 180 tablet, Rfl: 1 .  buPROPion (WELLBUTRIN XL) 300 MG 24 hr tablet, Take 1 tablet (300 mg total) by mouth daily., Disp: 90 tablet, Rfl: 3 .  calcium carbonate (TUMS) 500 MG chewable tablet, Chew 2 tablets by mouth daily as needed for indigestion or heartburn. , Disp: , Rfl:  .  dexlansoprazole (DEXILANT) 60 MG capsule, Take 60 mg by mouth daily. , Disp: , Rfl:  .  docusate sodium (COLACE) 100 MG capsule, Take 100 mg by mouth daily., Disp: , Rfl:  .  Dulaglutide (TRULICITY) 6.21 HY/8.6VH SOPN, Inject into the skin., Disp: , Rfl:  .  escitalopram (LEXAPRO) 5 MG tablet, Take 1 tablet (5 mg total) by mouth daily., Disp: 90 tablet, Rfl: 3 .  estrogens, conjugated, (PREMARIN) 1.25 MG tablet, Take 1.25 mg by mouth daily. , Disp: , Rfl:  .   famotidine (PEPCID AC) 10 MG tablet, Take 10 mg by mouth daily. , Disp: , Rfl:  .  Fe-Succ-C-Thre-B12-Des Stomach 70 MG TABS, Take by mouth., Disp: , Rfl:  .  FIBER SELECT GUMMIES CHEW, Chew 2 Doses by mouth daily. , Disp: , Rfl:  .  hydrochlorothiazide (MICROZIDE) 12.5 MG capsule, TAKE 1 CAPSULE BY MOUTH EVERY DAY, Disp: 90 capsule, Rfl: 3 .  magnesium oxide (MAG-OX) 400 MG tablet, Take 400 mg by mouth daily. , Disp: , Rfl:  .  metFORMIN (GLUCOPHAGE) 1000 MG tablet, TAKE 1 TABLET TWICE DAILY WITH MEALS, Disp: 180 tablet, Rfl: 0  No Known Allergies Review of Systems Objective:  There were no vitals filed for this visit.  General: Well developed, nourished, in no acute distress, alert and oriented x3   Dermatological: Skin is warm, dry and supple bilateral. Nails x 10 are well maintained; remaining integument appears unremarkable at this time. There are no open sores, no preulcerative lesions, no rash or signs of infection present.  Vascular: Dorsalis Pedis artery and Posterior Tibial artery pedal pulses are 2/4 bilateral with immedate capillary fill time. Pedal hair growth present. No varicosities and no lower extremity edema present bilateral.   Neruologic: Grossly intact via light touch bilateral. Vibratory intact via tuning fork bilateral. Protective  threshold with Semmes Wienstein monofilament intact to all pedal sites bilateral. Patellar and Achilles deep tendon reflexes 2+ bilateral. No Babinski or clonus noted bilateral.  Palpable Mulder's click third interdigital space left foot.  Musculoskeletal: No gross boney pedal deformities bilateral. No pain, crepitus, or limitation noted with foot and ankle range of motion bilateral. Muscular strength 5/5 in all groups tested bilateral.  Gait: Unassisted, Nonantalgic.    Radiographs:  Radiographs demonstrate no osseous abnormalities.  No acute findings.  Osseously mature individual second metatarsal osteotomy.  Screw retention second  met.  Assessment & Plan:   Assessment: Neuroma third interdigital space left.  Plan: Discussed etiology pathology conservative versus surgical therapies we discussed appropriate shoe gear stretching exercises ice therapy and shoe gear modifications.  We also discussed injecting the area today which we did with 10 mg Kenalog 5 mg of Marcaine point of maximal tenderness.  Tolerated procedure well.  Follow-up with her in about a month.     Jackston [place] T. [place], [place]

## 2019-01-22 ENCOUNTER — Ambulatory Visit: Payer: BC Managed Care – PPO | Admitting: Podiatry

## 2019-02-06 DIAGNOSIS — N760 Acute vaginitis: Secondary | ICD-10-CM | POA: Diagnosis not present

## 2019-02-06 DIAGNOSIS — Z87440 Personal history of urinary (tract) infections: Secondary | ICD-10-CM | POA: Diagnosis not present

## 2019-02-28 ENCOUNTER — Other Ambulatory Visit: Payer: Self-pay

## 2019-02-28 ENCOUNTER — Encounter: Payer: Self-pay | Admitting: Podiatry

## 2019-02-28 ENCOUNTER — Ambulatory Visit: Payer: BC Managed Care – PPO | Admitting: Podiatry

## 2019-02-28 DIAGNOSIS — G5762 Lesion of plantar nerve, left lower limb: Secondary | ICD-10-CM | POA: Diagnosis not present

## 2019-02-28 DIAGNOSIS — G5782 Other specified mononeuropathies of left lower limb: Secondary | ICD-10-CM

## 2019-02-28 NOTE — Progress Notes (Signed)
She presents today states that she is doing much better she follows up for neuroma third interdigital space of the left foot.  Objective: Vital signs are stable alert and oriented x3.  Pulses are palpable.  She still has a diastases between toes 3 and 4 of the left foot but no tenderness on palpation.  No palpable neuroma no Mulder sign.  Assessment: Nice reduction of the neuroma third interdigital space left foot.  Plan: Continue to wear wide soled shoes and metatarsal pads and I will follow-up with her as needed.

## 2019-03-07 DIAGNOSIS — Z0189 Encounter for other specified special examinations: Secondary | ICD-10-CM | POA: Diagnosis not present

## 2019-03-19 ENCOUNTER — Other Ambulatory Visit: Payer: Self-pay | Admitting: Family Medicine

## 2019-03-19 DIAGNOSIS — F411 Generalized anxiety disorder: Secondary | ICD-10-CM

## 2019-03-19 MED ORDER — ESCITALOPRAM OXALATE 5 MG PO TABS: 5.0000 mg | ORAL_TABLET | Freq: Every day | ORAL | 3 refills | Status: DC

## 2019-03-19 MED ORDER — BUPROPION HCL ER (XL) 300 MG PO TB24: 300.0000 mg | ORAL_TABLET | Freq: Every day | ORAL | 3 refills | Status: DC

## 2019-03-19 MED ORDER — HYDROCHLOROTHIAZIDE 12.5 MG PO CAPS: ORAL_CAPSULE | ORAL | 3 refills | Status: DC

## 2019-03-19 NOTE — Telephone Encounter (Signed)
[facility] faxed refill request for the following medications:  buPROPion (WELLBUTRIN XL) 300 MG 24 hr tablet hydrochlorothiazide (MICROZIDE) 12.5 MG capsule escitalopram (LEXAPRO) 5 MG tablet  Please advise.  Thanks, [employer]

## 2019-04-12 DIAGNOSIS — D229 Melanocytic nevi, unspecified: Secondary | ICD-10-CM | POA: Diagnosis not present

## 2019-04-12 DIAGNOSIS — L438 Other lichen planus: Secondary | ICD-10-CM | POA: Diagnosis not present

## 2019-04-12 DIAGNOSIS — X32XXXA Exposure to sunlight, initial encounter: Secondary | ICD-10-CM | POA: Diagnosis not present

## 2019-04-12 DIAGNOSIS — D485 Neoplasm of uncertain behavior of skin: Secondary | ICD-10-CM | POA: Diagnosis not present

## 2019-04-12 DIAGNOSIS — R208 Other disturbances of skin sensation: Secondary | ICD-10-CM | POA: Diagnosis not present

## 2019-04-12 DIAGNOSIS — L814 Other melanin hyperpigmentation: Secondary | ICD-10-CM | POA: Diagnosis not present

## 2019-04-12 DIAGNOSIS — L821 Other seborrheic keratosis: Secondary | ICD-10-CM | POA: Diagnosis not present

## 2019-04-16 DIAGNOSIS — Z0189 Encounter for other specified special examinations: Secondary | ICD-10-CM | POA: Diagnosis not present

## 2019-04-16 LAB — TSH: TSH: 3.5 (ref <=5.90)

## 2019-04-16 LAB — CBC AND DIFFERENTIAL
HCT: 38 (ref 36–46)
Hemoglobin: 12.9 (ref 12.0–16.0)
Neutrophils Absolute: 4
Platelets: 321 (ref 150–399)
WBC: 7.9

## 2019-04-16 LAB — COMPREHENSIVE METABOLIC PANEL
Albumin: 4 (ref 3.5–5.0)
Calcium: 9.3 (ref 8.7–10.7)
GFR calc non Af Amer: 82
Globulin: 2.4

## 2019-04-16 LAB — CBC: RBC: 4.3 (ref 3.87–5.11)

## 2019-04-16 LAB — HEPATIC FUNCTION PANEL
ALT: 14 (ref 7–35)
AST: 17 (ref 13–35)
Alkaline Phosphatase: 90 (ref 25–125)
Bilirubin, Total: 0.2

## 2019-04-16 LAB — HEMOGLOBIN A1C: Hemoglobin A1C: 5.4

## 2019-04-16 LAB — LIPID PANEL
Cholesterol: 236 — AB (ref 0–200)
HDL: 60 (ref 35–70)
Triglycerides: 129 (ref 40–160)

## 2019-04-16 LAB — IRON,TIBC AND FERRITIN PANEL: Iron: 77

## 2019-04-16 LAB — BASIC METABOLIC PANEL
BUN: 14 (ref 4–21)
Chloride: 100 (ref 99–108)
Creatinine: 0.8 (ref <=1.1)
Glucose: 87
Potassium: 4.4 (ref 3.4–5.3)
Sodium: 138 (ref 137–147)

## 2019-04-16 LAB — VITAMIN D 25 HYDROXY (VIT D DEFICIENCY, FRACTURES): Vit D, 25-Hydroxy: 27.4

## 2019-04-17 DIAGNOSIS — Z713 Dietary counseling and surveillance: Secondary | ICD-10-CM | POA: Diagnosis not present

## 2019-04-17 DIAGNOSIS — E785 Hyperlipidemia, unspecified: Secondary | ICD-10-CM | POA: Diagnosis not present

## 2019-04-17 DIAGNOSIS — Z043 Encounter for examination and observation following other accident: Secondary | ICD-10-CM | POA: Diagnosis not present

## 2019-04-17 DIAGNOSIS — Z0189 Encounter for other specified special examinations: Secondary | ICD-10-CM | POA: Diagnosis not present

## 2019-04-26 LAB — LAB REPORT - SCANNED
Albumin/Globulin Ratio: 1.7
BASO(ABSOLUTE): 0
BUN/Creatinine Ratio: 17
Basophils Absolute: 1
EOS (ABSOLUTE): 0.5
EOS: 6 %
Free Thyroxine Index: 1.6
Immature Granulocytes: 0
LDH: 164
LDL Chol Calc (NIH): 153
LYMPH#: 34
Lymphs Abs: 2.7
MCH: 30
MCHC: 34
MCV: 88 (ref 76–111)
Monocyes absolute: 0.5 10*3/uL (ref 0.1–1)
Monocyte %: 6
Neutrophils: 53
Phosphorus: 3.7
RDW: 13.4
T3 Uptake: 16
Thyroxine (T4): 9.7
Total Protein: 6.4 g/dL
Uric Acid: 5.8
VLDL Cholesterol Cal: 23

## 2019-05-22 DIAGNOSIS — M7711 Lateral epicondylitis, right elbow: Secondary | ICD-10-CM | POA: Diagnosis not present

## 2019-05-28 ENCOUNTER — Other Ambulatory Visit: Payer: Self-pay | Admitting: Family Medicine

## 2019-05-28 NOTE — Telephone Encounter (Signed)
[facility] faxed refill request for the following medications:  escitalopram (LEXAPRO) 5 MG tablet   Please advise.

## 2019-05-28 NOTE — Telephone Encounter (Signed)
Rx was filled 03/19/2019 with refills.

## 2019-05-29 DIAGNOSIS — L309 Dermatitis, unspecified: Secondary | ICD-10-CM | POA: Diagnosis not present

## 2019-06-06 ENCOUNTER — Other Ambulatory Visit: Payer: Self-pay

## 2019-06-06 DIAGNOSIS — F411 Generalized anxiety disorder: Secondary | ICD-10-CM

## 2019-06-06 MED ORDER — ESCITALOPRAM OXALATE 5 MG PO TABS: 5.0000 mg | ORAL_TABLET | Freq: Every day | ORAL | 3 refills | Status: DC

## 2019-06-15 ENCOUNTER — Other Ambulatory Visit: Payer: Self-pay | Admitting: Family Medicine

## 2019-06-15 DIAGNOSIS — F411 Generalized anxiety disorder: Secondary | ICD-10-CM

## 2019-06-18 DIAGNOSIS — L309 Dermatitis, unspecified: Secondary | ICD-10-CM | POA: Diagnosis not present

## 2019-06-18 DIAGNOSIS — L710 Perioral dermatitis: Secondary | ICD-10-CM | POA: Diagnosis not present

## 2019-06-25 DIAGNOSIS — R21 Rash and other nonspecific skin eruption: Secondary | ICD-10-CM | POA: Diagnosis not present

## 2019-07-30 DIAGNOSIS — L814 Other melanin hyperpigmentation: Secondary | ICD-10-CM | POA: Diagnosis not present

## 2019-07-30 DIAGNOSIS — L710 Perioral dermatitis: Secondary | ICD-10-CM | POA: Diagnosis not present

## 2019-08-20 DIAGNOSIS — Z1231 Encounter for screening mammogram for malignant neoplasm of breast: Secondary | ICD-10-CM | POA: Diagnosis not present

## 2019-08-20 DIAGNOSIS — Z13820 Encounter for screening for osteoporosis: Secondary | ICD-10-CM | POA: Diagnosis not present

## 2019-08-20 DIAGNOSIS — Z6826 Body mass index (BMI) 26.0-26.9, adult: Secondary | ICD-10-CM | POA: Diagnosis not present

## 2019-08-20 DIAGNOSIS — Z01419 Encounter for gynecological examination (general) (routine) without abnormal findings: Secondary | ICD-10-CM | POA: Diagnosis not present

## 2019-09-12 ENCOUNTER — Other Ambulatory Visit: Payer: Self-pay

## 2019-09-12 ENCOUNTER — Encounter: Payer: Self-pay | Admitting: Podiatry

## 2019-09-12 ENCOUNTER — Ambulatory Visit (INDEPENDENT_AMBULATORY_CARE_PROVIDER_SITE_OTHER): Payer: BC Managed Care – PPO | Admitting: Podiatry

## 2019-09-12 DIAGNOSIS — G5762 Lesion of plantar nerve, left lower limb: Secondary | ICD-10-CM | POA: Diagnosis not present

## 2019-09-12 DIAGNOSIS — G5782 Other specified mononeuropathies of left lower limb: Secondary | ICD-10-CM

## 2019-09-12 NOTE — Progress Notes (Signed)
She presents today chief complaint of pain between the third and fourth digits of the left foot.  States that he has been doing very good since about February.  And over the past couple weeks ago started to bother him again.  Objective: Vital signs stable alert oriented x3 there is no erythema edema cellulitis drainage odor painful neuroma third interdigital space with palpable Mulder's click.  Assessment: Neuroma third interspace left.  Plan: I injected today 10 mg of Kenalog 5 mg Marcaine for maximal tenderness third interspace left foot follow-up with [place] as needed.

## 2019-09-18 DIAGNOSIS — M1712 Unilateral primary osteoarthritis, left knee: Secondary | ICD-10-CM | POA: Diagnosis not present

## 2019-10-03 DIAGNOSIS — Z0189 Encounter for other specified special examinations: Secondary | ICD-10-CM | POA: Diagnosis not present

## 2019-10-03 DIAGNOSIS — Z23 Encounter for immunization: Secondary | ICD-10-CM | POA: Diagnosis not present

## 2019-10-15 NOTE — Progress Notes (Signed)
I,April Miller,acting as a scribe for Megan Mans, MD.,have documented all relevant documentation on the behalf of Megan Mans, MD,as directed by  Megan Mans, MD while in the presence of Megan Mans, MD.   Complete physical exam   [name]   DOB: 16-Sep-1970   49 y.o. Female  MRN: [id number] Visit Date: 10/16/2019  Today's healthcare provider: Megan Mans, MD   Chief Complaint  Patient presents with  . Annual Exam   Subjective    Cindy Duncan is a 49 y.o. female who presents today for a complete physical exam.  She reports consuming a general diet. Home exercise routine includes Tennis and works out. She generally feels well. She reports sleeping well. She does not have additional problems to discuss today.  Patient is married and plays tennis almost daily.  Her's only son is a sophomore at [employer].  She feels well. HPI    Past Medical History:  Diagnosis Date  . Depression   . Diabetes mellitus without complication (HCC)   . GERD (gastroesophageal reflux disease)   . Hypertension    Past Surgical History:  Procedure Laterality Date  . ABDOMINAL HYSTERECTOMY     complete  . ABLATION     uterine ablation  . BUNIONECTOMY Bilateral   . CESAREAN SECTION    . KNEE SURGERY     torn miniscus repair  . TONSILLECTOMY AND ADENOIDECTOMY    . TUBAL LIGATION    . UPPER GI ENDOSCOPY  10/05/07   normal stomach, duodenum, gerd without esophagitis   Social History   Socioeconomic History  . Marital status: Legally Separated    Spouse name: Not on file  . Number of children: Not on file  . Years of education: Not on file  . Highest education level: Not on file  Occupational History  . Not on file  Tobacco Use  . Smoking status: Former Smoker    Packs/day: 1.00    Years: 8.00    Pack years: 8.00  . Smokeless tobacco: Never Used  Vaping Use  . Vaping Use: Never used  Substance and Sexual Activity  . Alcohol use: No   . Drug use: No  . Sexual activity: Not on file  Other Topics Concern  . Not on file  Social History Narrative  . Not on file   Social Determinants of Health   Financial Resource Strain:   . Difficulty of Paying Living Expenses: Not on file  Food Insecurity:   . Worried About Programme researcher, broadcasting/film/video in the Last Year: Not on file  . Ran Out of Food in the Last Year: Not on file  Transportation Needs:   . Lack of Transportation (Medical): Not on file  . Lack of Transportation (Non-Medical): Not on file  Physical Activity:   . Days of Exercise per Week: Not on file  . Minutes of Exercise per Session: Not on file  Stress:   . Feeling of Stress : Not on file  Social Connections:   . Frequency of Communication with Friends and Family: Not on file  . Frequency of Social Gatherings with Friends and Family: Not on file  . Attends Religious Services: Not on file  . Active Member of Clubs or Organizations: Not on file  . Attends Banker Meetings: Not on file  . Marital Status: Not on file  Intimate Partner Violence:   . Fear of Current or Ex-Partner: Not on file  .  Emotionally Abused: Not on file  . Physically Abused: Not on file  . Sexually Abused: Not on file   Family Status  Relation Name Status  . Mother  Alive  . Son  Alive  . MGM  Deceased  . MGF  Deceased  . PGM  Deceased  . PGF  Deceased  . Father  (Not Specified)  . Mat Uncle  (Not Specified)  . Emelda BrothersPat Aunt  (Not Specified)   Family History  Problem Relation Age of Onset  . Hypothyroidism Mother   . Stroke Maternal Grandmother   . Prostate cancer Maternal Grandfather   . Hypertension Paternal Grandmother   . Renal Disease Paternal Grandmother   . COPD Paternal Grandfather   . Hypertension Paternal Grandfather   . Congestive Heart Failure Paternal Grandfather   . Kidney disease Father   . Hypertension Father   . Dementia Father   . Muscular dystrophy Maternal Uncle   . Hypertension Paternal Aunt     No Known Allergies  Patient Care Team: Maple HudsonGilbert, Oline Belk L Jr., MD as PCP - General (Family Medicine)   Medications: Outpatient Medications Prior to Visit  Medication Sig  . ALPRAZolam (XANAX) 0.5 MG tablet Take 1 tablet (0.5 mg total) by mouth 2 (two) times daily as needed.  Marland Kitchen. buPROPion (WELLBUTRIN XL) 300 MG 24 hr tablet Take 1 tablet (300 mg total) by mouth daily.  . calcium carbonate (TUMS) 500 MG chewable tablet Chew 2 tablets by mouth daily as needed for indigestion or heartburn.   . dexlansoprazole (DEXILANT) 60 MG capsule Take 60 mg by mouth daily.   Marland Kitchen. docusate sodium (COLACE) 100 MG capsule Take 100 mg by mouth daily.  . Dulaglutide (TRULICITY) 0.75 MG/0.5ML SOPN Inject into the skin.  Marland Kitchen. escitalopram (LEXAPRO) 5 MG tablet Take 1 tablet (5 mg total) by mouth daily.  Marland Kitchen. estrogens, conjugated, (PREMARIN) 1.25 MG tablet Take 1.25 mg by mouth daily.   . famotidine (PEPCID AC) 10 MG tablet Take 10 mg by mouth daily.   Marland Kitchen. Fe-Succ-C-Thre-B12-Des Stomach 70 MG TABS Take by mouth.  . FIBER SELECT GUMMIES CHEW Chew 2 Doses by mouth daily.   . hydrochlorothiazide (MICROZIDE) 12.5 MG capsule TAKE 1 CAPSULE BY MOUTH EVERY DAY  . magnesium oxide (MAG-OX) 400 MG tablet Take 400 mg by mouth daily.   . metFORMIN (GLUCOPHAGE) 1000 MG tablet TAKE 1 TABLET TWICE DAILY WITH MEALS  . etodolac (LODINE) 400 MG tablet Take 400 mg by mouth every 8 (eight) hours as needed. (Patient not taking: Reported on 10/16/2019)  . valACYclovir (VALTREX) 1000 MG tablet Take 1,000 mg by mouth 3 (three) times daily. (Patient not taking: Reported on 10/16/2019)   No facility-administered medications prior to visit.    Review of Systems  All other systems reviewed and are negative.     Objective    BP 131/87 (BP Location: Right Arm, Patient Position: Sitting, Cuff Size: Large)   Pulse 87   Temp 98.5 F (36.9 C) (Oral)   Resp 16   Ht 5\' 1"  (1.549 m)   Wt 140 lb (63.5 kg)   SpO2 99%   BMI 26.45 kg/m     Physical Exam Vitals reviewed.  Constitutional:      Appearance: She is well-developed.  HENT:     Head: Normocephalic and atraumatic.     Right Ear: External ear normal.     Left Ear: External ear normal.     Nose: Nose normal.  Eyes:     Conjunctiva/sclera:  Conjunctivae normal.     Pupils: Pupils are equal, round, and reactive to light.  Cardiovascular:     Rate and Rhythm: Normal rate and regular rhythm.     Heart sounds: Normal heart sounds.  Pulmonary:     Effort: Pulmonary effort is normal.     Breath sounds: Normal breath sounds.  Abdominal:     General: Bowel sounds are normal.     Palpations: Abdomen is soft.  Musculoskeletal:        General: Normal range of motion.     Cervical back: Normal range of motion and neck supple.  Skin:    General: Skin is warm and dry.  Neurological:     General: No focal deficit present.     Mental Status: She is alert and oriented to person, place, and time.  Psychiatric:        Mood and Affect: Mood normal.        Behavior: Behavior normal.        Thought Content: Thought content normal.        Judgment: Judgment normal.        Last depression screening scores PHQ 2/9 Scores 10/16/2019 10/12/2018 10/06/2017  PHQ - 2 Score 0 0 0  PHQ- 9 Score 0 0 -   Last fall risk screening Fall Risk  10/12/2018  Falls in the past year? 0  Number falls in past yr: 0  Injury with Fall? 0   Last Audit-C alcohol use screening Alcohol Use Disorder Test (AUDIT) 10/16/2019  1. How often do you have a drink containing alcohol? 0  2. How many drinks containing alcohol do you have on a typical day when you are drinking? 0  3. How often do you have six or more drinks on one occasion? 0  AUDIT-C Score 0  Alcohol Brief Interventions/Follow-up AUDIT Score <7 follow-up not indicated   A score of 3 or more in women, and 4 or more in men indicates increased risk for alcohol abuse, EXCEPT if all of the points are from question 1   No results found  for any visits on 10/16/19.  Assessment & Plan    Routine Health Maintenance and Physical Exam  Exercise Activities and Dietary recommendations Goals   None     Immunization History  Administered Date(s) Administered  . Influenza,inj,Quad PF,6+ Mos 10/05/2016  . Influenza-Unspecified 09/19/2017, 10/02/2019    Health Maintenance  Topic Date Due  . Hepatitis C Screening  Never done  . COVID-19 Vaccine (1) Never done  . HIV Screening  Never done  . TETANUS/TDAP  Never done  . MAMMOGRAM  Never done  . COLONOSCOPY  07/25/2023  . INFLUENZA VACCINE  Completed  . PAP SMEAR-Modifier  Discontinued    Discussed health benefits of physical activity, and encouraged her to engage in regular exercise appropriate for her age and condition.  1. Annual physical exam Woman exam per GYN. Colonoscopy in 2018  2. Gastro-esophageal reflux disease without esophagitis   3. Recurrent major depressive disorder, in full remission (HCC) Clinically stable on present medications.  Follow-up 1 year.  Offered 63-month follow-up the patient wishes to continue present medications as she is  4. Anxiety Clinically stable.  5. Iron deficiency anemia secondary to inadequate dietary iron intake   6. GAD (generalized anxiety disorder)  - ALPRAZolam (XANAX) 0.5 MG tablet; Take 1 tablet (0.5 mg total) by mouth 2 (two) times daily as needed.  Dispense: 180 tablet; Refill: 1 - buPROPion (WELLBUTRIN XL) 300  MG 24 hr tablet; Take 1 tablet (300 mg total) by mouth daily.  Dispense: 90 tablet; Refill: 3 - escitalopram (LEXAPRO) 5 MG tablet; Take 1 tablet (5 mg total) by mouth daily.  Dispense: 90 tablet; Refill: 3   No follow-ups on file.        Younes Degeorge Wendelyn Breslow, MD  [facility] [phone] (phone) [phone] (fax)  [facility]

## 2019-10-16 ENCOUNTER — Other Ambulatory Visit: Payer: Self-pay

## 2019-10-16 ENCOUNTER — Ambulatory Visit (INDEPENDENT_AMBULATORY_CARE_PROVIDER_SITE_OTHER): Payer: BC Managed Care – PPO | Admitting: Family Medicine

## 2019-10-16 ENCOUNTER — Encounter: Payer: Self-pay | Admitting: Family Medicine

## 2019-10-16 VITALS — BP 131/87 | HR 87 | Temp 98.5°F | Resp 16 | Ht 61.0 in | Wt 140.0 lb

## 2019-10-16 DIAGNOSIS — F3342 Major depressive disorder, recurrent, in full remission: Secondary | ICD-10-CM | POA: Diagnosis not present

## 2019-10-16 DIAGNOSIS — F411 Generalized anxiety disorder: Secondary | ICD-10-CM

## 2019-10-16 DIAGNOSIS — D508 Other iron deficiency anemias: Secondary | ICD-10-CM

## 2019-10-16 DIAGNOSIS — F419 Anxiety disorder, unspecified: Secondary | ICD-10-CM

## 2019-10-16 DIAGNOSIS — K219 Gastro-esophageal reflux disease without esophagitis: Secondary | ICD-10-CM | POA: Diagnosis not present

## 2019-10-16 DIAGNOSIS — Z0000 Encounter for general adult medical examination without abnormal findings: Secondary | ICD-10-CM

## 2019-10-16 MED ORDER — ESCITALOPRAM OXALATE 5 MG PO TABS: 5.0000 mg | ORAL_TABLET | Freq: Every day | ORAL | 3 refills | Status: DC

## 2019-10-16 MED ORDER — BUPROPION HCL ER (XL) 300 MG PO TB24: 300.0000 mg | ORAL_TABLET | Freq: Every day | ORAL | 3 refills | Status: DC

## 2019-10-16 MED ORDER — ALPRAZOLAM 0.5 MG PO TABS: 0.5000 mg | ORAL_TABLET | Freq: Two times a day (BID) | ORAL | 1 refills | Status: DC | PRN

## 2019-10-29 DIAGNOSIS — Z23 Encounter for immunization: Secondary | ICD-10-CM | POA: Diagnosis not present

## 2019-10-29 DIAGNOSIS — R5383 Other fatigue: Secondary | ICD-10-CM | POA: Diagnosis not present

## 2019-10-31 DIAGNOSIS — Z20822 Contact with and (suspected) exposure to covid-19: Secondary | ICD-10-CM | POA: Diagnosis not present

## 2019-11-08 DIAGNOSIS — Z20822 Contact with and (suspected) exposure to covid-19: Secondary | ICD-10-CM | POA: Diagnosis not present

## 2020-01-22 DIAGNOSIS — M25521 Pain in right elbow: Secondary | ICD-10-CM | POA: Diagnosis not present

## 2020-01-22 DIAGNOSIS — M6281 Muscle weakness (generalized): Secondary | ICD-10-CM | POA: Diagnosis not present

## 2020-01-24 DIAGNOSIS — B373 Candidiasis of vulva and vagina: Secondary | ICD-10-CM | POA: Diagnosis not present

## 2020-01-29 DIAGNOSIS — M25521 Pain in right elbow: Secondary | ICD-10-CM | POA: Diagnosis not present

## 2020-01-29 DIAGNOSIS — M6281 Muscle weakness (generalized): Secondary | ICD-10-CM | POA: Diagnosis not present

## 2020-01-31 DIAGNOSIS — M6281 Muscle weakness (generalized): Secondary | ICD-10-CM | POA: Diagnosis not present

## 2020-01-31 DIAGNOSIS — M25521 Pain in right elbow: Secondary | ICD-10-CM | POA: Diagnosis not present

## 2020-02-06 DIAGNOSIS — M6281 Muscle weakness (generalized): Secondary | ICD-10-CM | POA: Diagnosis not present

## 2020-02-06 DIAGNOSIS — M25521 Pain in right elbow: Secondary | ICD-10-CM | POA: Diagnosis not present

## 2020-02-11 DIAGNOSIS — M25521 Pain in right elbow: Secondary | ICD-10-CM | POA: Diagnosis not present

## 2020-02-11 DIAGNOSIS — M6281 Muscle weakness (generalized): Secondary | ICD-10-CM | POA: Diagnosis not present

## 2020-02-14 DIAGNOSIS — M6281 Muscle weakness (generalized): Secondary | ICD-10-CM | POA: Diagnosis not present

## 2020-02-14 DIAGNOSIS — M25521 Pain in right elbow: Secondary | ICD-10-CM | POA: Diagnosis not present

## 2020-02-20 ENCOUNTER — Other Ambulatory Visit: Payer: Self-pay | Admitting: Family Medicine

## 2020-02-20 DIAGNOSIS — F411 Generalized anxiety disorder: Secondary | ICD-10-CM

## 2020-02-22 DIAGNOSIS — M25521 Pain in right elbow: Secondary | ICD-10-CM | POA: Diagnosis not present

## 2020-02-22 DIAGNOSIS — M6281 Muscle weakness (generalized): Secondary | ICD-10-CM | POA: Diagnosis not present

## 2020-02-27 DIAGNOSIS — M6281 Muscle weakness (generalized): Secondary | ICD-10-CM | POA: Diagnosis not present

## 2020-02-27 DIAGNOSIS — M25521 Pain in right elbow: Secondary | ICD-10-CM | POA: Diagnosis not present

## 2020-02-29 DIAGNOSIS — M6281 Muscle weakness (generalized): Secondary | ICD-10-CM | POA: Diagnosis not present

## 2020-02-29 DIAGNOSIS — M25521 Pain in right elbow: Secondary | ICD-10-CM | POA: Diagnosis not present

## 2020-03-05 DIAGNOSIS — M6281 Muscle weakness (generalized): Secondary | ICD-10-CM | POA: Diagnosis not present

## 2020-03-05 DIAGNOSIS — M25521 Pain in right elbow: Secondary | ICD-10-CM | POA: Diagnosis not present

## 2020-03-12 DIAGNOSIS — M6281 Muscle weakness (generalized): Secondary | ICD-10-CM | POA: Diagnosis not present

## 2020-03-12 DIAGNOSIS — M25521 Pain in right elbow: Secondary | ICD-10-CM | POA: Diagnosis not present

## 2020-04-07 DIAGNOSIS — Z0189 Encounter for other specified special examinations: Secondary | ICD-10-CM | POA: Diagnosis not present

## 2020-04-08 DIAGNOSIS — Z043 Encounter for examination and observation following other accident: Secondary | ICD-10-CM | POA: Diagnosis not present

## 2020-04-08 DIAGNOSIS — E785 Hyperlipidemia, unspecified: Secondary | ICD-10-CM | POA: Diagnosis not present

## 2020-04-08 DIAGNOSIS — Z713 Dietary counseling and surveillance: Secondary | ICD-10-CM | POA: Diagnosis not present

## 2020-04-14 DIAGNOSIS — X32XXXA Exposure to sunlight, initial encounter: Secondary | ICD-10-CM | POA: Diagnosis not present

## 2020-04-14 DIAGNOSIS — L570 Actinic keratosis: Secondary | ICD-10-CM | POA: Diagnosis not present

## 2020-04-14 DIAGNOSIS — D2261 Melanocytic nevi of right upper limb, including shoulder: Secondary | ICD-10-CM | POA: Diagnosis not present

## 2020-04-14 DIAGNOSIS — D225 Melanocytic nevi of trunk: Secondary | ICD-10-CM | POA: Diagnosis not present

## 2020-04-14 DIAGNOSIS — D2262 Melanocytic nevi of left upper limb, including shoulder: Secondary | ICD-10-CM | POA: Diagnosis not present

## 2020-04-14 DIAGNOSIS — D2272 Melanocytic nevi of left lower limb, including hip: Secondary | ICD-10-CM | POA: Diagnosis not present

## 2020-04-22 DIAGNOSIS — M1712 Unilateral primary osteoarthritis, left knee: Secondary | ICD-10-CM | POA: Diagnosis not present

## 2020-04-28 ENCOUNTER — Other Ambulatory Visit: Payer: Self-pay | Admitting: Family Medicine

## 2020-04-28 DIAGNOSIS — F411 Generalized anxiety disorder: Secondary | ICD-10-CM

## 2020-05-05 DIAGNOSIS — E119 Type 2 diabetes mellitus without complications: Secondary | ICD-10-CM | POA: Diagnosis not present

## 2020-05-05 LAB — HM DIABETES EYE EXAM

## 2020-05-26 ENCOUNTER — Encounter: Payer: Self-pay | Admitting: Podiatry

## 2020-05-26 ENCOUNTER — Ambulatory Visit (INDEPENDENT_AMBULATORY_CARE_PROVIDER_SITE_OTHER): Payer: BC Managed Care – PPO | Admitting: Podiatry

## 2020-05-26 ENCOUNTER — Other Ambulatory Visit: Payer: Self-pay

## 2020-05-26 DIAGNOSIS — G5762 Lesion of plantar nerve, left lower limb: Secondary | ICD-10-CM | POA: Diagnosis not present

## 2020-05-26 DIAGNOSIS — G5782 Other specified mononeuropathies of left lower limb: Secondary | ICD-10-CM

## 2020-05-26 MED ORDER — TRIAMCINOLONE ACETONIDE 40 MG/ML IJ SUSP
20.0000 mg | Freq: Once | INTRAMUSCULAR | Status: AC
  Administered 2020-05-26: 20 mg

## 2020-05-26 NOTE — Progress Notes (Signed)
Presents today complaining of neuroma third interspace left foot.  States is doing great for a while and has now come back.  Objective: Vital signs stable alert oriented x3 pulses are palpable.  Pain on palpation third interspace of the left foot.  Assessment: Neuritis third interspace left.  Orange neuroma  Plan: Sterile Betadine skin prep injected third interdigital space 10 mg Kenalog 5 mg Marcaine follow-up with me as needed

## 2020-06-24 DIAGNOSIS — Z0189 Encounter for other specified special examinations: Secondary | ICD-10-CM | POA: Diagnosis not present

## 2020-07-28 ENCOUNTER — Other Ambulatory Visit: Payer: Self-pay | Admitting: Family Medicine

## 2020-07-28 DIAGNOSIS — F411 Generalized anxiety disorder: Secondary | ICD-10-CM

## 2020-08-04 ENCOUNTER — Ambulatory Visit: Payer: BC Managed Care – PPO | Admitting: Podiatry

## 2020-08-25 DIAGNOSIS — R21 Rash and other nonspecific skin eruption: Secondary | ICD-10-CM | POA: Diagnosis not present

## 2020-09-05 DIAGNOSIS — Z6826 Body mass index (BMI) 26.0-26.9, adult: Secondary | ICD-10-CM | POA: Diagnosis not present

## 2020-09-05 DIAGNOSIS — Z01419 Encounter for gynecological examination (general) (routine) without abnormal findings: Secondary | ICD-10-CM | POA: Diagnosis not present

## 2020-09-05 DIAGNOSIS — Z1231 Encounter for screening mammogram for malignant neoplasm of breast: Secondary | ICD-10-CM | POA: Diagnosis not present

## 2020-10-06 DIAGNOSIS — Z23 Encounter for immunization: Secondary | ICD-10-CM | POA: Diagnosis not present

## 2020-10-16 ENCOUNTER — Ambulatory Visit (INDEPENDENT_AMBULATORY_CARE_PROVIDER_SITE_OTHER): Payer: BC Managed Care – PPO | Admitting: Family Medicine

## 2020-10-16 ENCOUNTER — Other Ambulatory Visit: Payer: Self-pay

## 2020-10-16 ENCOUNTER — Encounter: Payer: Self-pay | Admitting: Family Medicine

## 2020-10-16 VITALS — BP 128/83 | HR 78 | Temp 98.3°F | Ht 60.5 in | Wt 140.0 lb

## 2020-10-16 DIAGNOSIS — R7303 Prediabetes: Secondary | ICD-10-CM | POA: Diagnosis not present

## 2020-10-16 DIAGNOSIS — I10 Essential (primary) hypertension: Secondary | ICD-10-CM

## 2020-10-16 DIAGNOSIS — F419 Anxiety disorder, unspecified: Secondary | ICD-10-CM

## 2020-10-16 DIAGNOSIS — K219 Gastro-esophageal reflux disease without esophagitis: Secondary | ICD-10-CM

## 2020-10-16 DIAGNOSIS — Z0000 Encounter for general adult medical examination without abnormal findings: Secondary | ICD-10-CM

## 2020-10-16 DIAGNOSIS — D508 Other iron deficiency anemias: Secondary | ICD-10-CM

## 2020-10-16 DIAGNOSIS — F411 Generalized anxiety disorder: Secondary | ICD-10-CM

## 2020-10-16 DIAGNOSIS — E7800 Pure hypercholesterolemia, unspecified: Secondary | ICD-10-CM

## 2020-10-16 DIAGNOSIS — F3342 Major depressive disorder, recurrent, in full remission: Secondary | ICD-10-CM

## 2020-10-16 MED ORDER — ESCITALOPRAM OXALATE 5 MG PO TABS: 5.0000 mg | ORAL_TABLET | Freq: Every day | ORAL | 1 refills | Status: DC

## 2020-10-16 MED ORDER — BUPROPION HCL ER (XL) 300 MG PO TB24: 300.0000 mg | ORAL_TABLET | Freq: Every day | ORAL | 1 refills | Status: DC

## 2020-10-16 NOTE — Progress Notes (Signed)
Complete physical exam   [name]   DOB: 24-Jun-1970   50 y.o. Female  MRN: [id number] Visit Date: 10/16/2020  Today's healthcare provider: Megan Mans, MD   Chief Complaint  Patient presents with   Annual Exam    Subjective    Cindy Duncan is a 50 y.o. female who presents today for a complete physical exam.  She reports consuming a general diet.  Exercises regularly  She generally feels well. She reports sleeping well. She does not have additional problems to discuss today. Exercises almost daily. HPI    Past Medical History:  Diagnosis Date   Depression    Diabetes mellitus without complication (HCC)    GERD (gastroesophageal reflux disease)    Hypertension    Past Surgical History:  Procedure Laterality Date   ABDOMINAL HYSTERECTOMY     complete   ABLATION     uterine ablation   BUNIONECTOMY Bilateral    CESAREAN SECTION     KNEE SURGERY     torn miniscus repair   TONSILLECTOMY AND ADENOIDECTOMY     TUBAL LIGATION     UPPER GI ENDOSCOPY  10/05/07   normal stomach, duodenum, gerd without esophagitis   Social History   Socioeconomic History   Marital status: Legally Separated    Spouse name: Not on file   Number of children: Not on file   Years of education: Not on file   Highest education level: Not on file  Occupational History   Not on file  Tobacco Use   Smoking status: Former    Packs/day: 1.00    Years: 8.00    Pack years: 8.00    Types: Cigarettes   Smokeless tobacco: Never  Vaping Use   Vaping Use: Never used  Substance and Sexual Activity   Alcohol use: No   Drug use: No   Sexual activity: Not on file  Other Topics Concern   Not on file  Social History Narrative   Not on file   Social Determinants of Health   Financial Resource Strain: Not on file  Food Insecurity: Not on file  Transportation Needs: Not on file  Physical Activity: Not on file  Stress: Not on file  Social Connections: Not on file  Intimate  Partner Violence: Not on file   Family Status  Relation Name Status   Mother  Alive   Father  Deceased   MGM  Deceased   MGF  Deceased   PGM  Deceased   PGF  Deceased   Son  Alive   Mat Uncle  (Not Specified)   Oceanographer  (Not Specified)   Neg Hx  (Not Specified)   Family History  Problem Relation Age of Onset   Hypothyroidism Mother    Breast cancer Mother    Kidney disease Father    Hypertension Father    Dementia Father    Stroke Maternal Grandmother    Prostate cancer Maternal Grandfather    Hypertension Paternal Grandmother    Renal Disease Paternal Grandmother    COPD Paternal Grandfather    Hypertension Paternal Grandfather    Congestive Heart Failure Paternal Grandfather    Muscular dystrophy Maternal Uncle    Hypertension Paternal Aunt    Colon cancer Neg Hx    No Known Allergies  Patient Care Team: Maple Hudson., MD as PCP - General (Family Medicine)   Medications: Outpatient Medications Prior to Visit  Medication Sig   ALPRAZolam (  XANAX) 0.5 MG tablet Take 1 tablet (0.5 mg total) by mouth 2 (two) times daily as needed.   calcium carbonate (TUMS - DOSED IN MG ELEMENTAL CALCIUM) 500 MG chewable tablet Chew 2 tablets by mouth daily as needed for indigestion or heartburn.    docusate sodium (COLACE) 100 MG capsule Take 100 mg by mouth daily.   Dulaglutide 0.75 MG/0.5ML SOPN Inject into the skin.   esomeprazole (NEXIUM) 40 MG capsule Take 40 mg by mouth daily.   estrogens, conjugated, (PREMARIN) 1.25 MG tablet Take 1.25 mg by mouth daily.    famotidine (PEPCID) 10 MG tablet Take 10 mg by mouth daily.    Fe-Succ-C-Thre-B12-Des Stomach 70 MG TABS Take by mouth.   FIBER SELECT GUMMIES CHEW Chew 2 Doses by mouth daily.    hydrochlorothiazide (MICROZIDE) 12.5 MG capsule TAKE 1 CAPSULE DAILY   magnesium oxide (MAG-OX) 400 MG tablet Take 400 mg by mouth daily.    metFORMIN (GLUCOPHAGE) 1000 MG tablet TAKE 1 TABLET TWICE DAILY WITH MEALS   dexlansoprazole  (DEXILANT) 60 MG capsule Take 60 mg by mouth daily.    etodolac (LODINE) 400 MG tablet Take 400 mg by mouth every 8 (eight) hours as needed.   valACYclovir (VALTREX) 1000 MG tablet Take 1,000 mg by mouth 3 (three) times daily.   [DISCONTINUED] buPROPion (WELLBUTRIN XL) 300 MG 24 hr tablet TAKE 1 TABLET DAILY   [DISCONTINUED] escitalopram (LEXAPRO) 5 MG tablet TAKE 1 TABLET DAILY   No facility-administered medications prior to visit.    Review of Systems  Constitutional: Negative.   HENT: Negative.    Eyes: Negative.   Respiratory: Negative.    Cardiovascular: Negative.   Gastrointestinal: Negative.   Endocrine: Negative.   Genitourinary: Negative.   Musculoskeletal: Negative.   Skin: Negative.   Allergic/Immunologic: Negative.   Neurological: Negative.   Hematological: Negative.   Psychiatric/Behavioral: Negative.    All other systems reviewed and are negative.    Objective    BP 128/83 (BP Location: Right Arm, Patient Position: Sitting, Cuff Size: Large)   Pulse 78   Temp 98.3 F (36.8 C) (Oral)   Ht 5' 0.5" (1.537 m)   Wt 140 lb (63.5 kg)   SpO2 98%   BMI 26.89 kg/m    Physical Exam Vitals reviewed.  Constitutional:      Appearance: She is well-developed.  HENT:     Head: Normocephalic and atraumatic.     Right Ear: External ear normal.     Left Ear: External ear normal.     Nose: Nose normal.  Eyes:     Conjunctiva/sclera: Conjunctivae normal.     Pupils: Pupils are equal, round, and reactive to light.  Cardiovascular:     Rate and Rhythm: Normal rate and regular rhythm.     Heart sounds: Normal heart sounds.  Pulmonary:     Effort: Pulmonary effort is normal.     Breath sounds: Normal breath sounds.  Abdominal:     General: Bowel sounds are normal.     Palpations: Abdomen is soft.  Musculoskeletal:        General: Normal range of motion.     Cervical back: Normal range of motion and neck supple.  Skin:    General: Skin is warm and dry.   Neurological:     General: No focal deficit present.     Mental Status: She is alert and oriented to person, place, and time.  Psychiatric:        Mood and  Affect: Mood normal.        Behavior: Behavior normal.        Thought Content: Thought content normal.        Judgment: Judgment normal.      Last depression screening scores PHQ 2/9 Scores 10/16/2019 10/12/2018 10/06/2017  PHQ - 2 Score 0 0 0  PHQ- 9 Score 0 0 -   Last fall risk screening Fall Risk  10/12/2018  Falls in the past year? 0  Number falls in past yr: 0  Injury with Fall? 0   Last Audit-C alcohol use screening Alcohol Use Disorder Test (AUDIT) 10/16/2019  1. How often do you have a drink containing alcohol? 0  2. How many drinks containing alcohol do you have on a typical day when you are drinking? 0  3. How often do you have six or more drinks on one occasion? 0  AUDIT-C Score 0  Alcohol Brief Interventions/Follow-up AUDIT Score <7 follow-up not indicated   A score of 3 or more in women, and 4 or more in men indicates increased risk for alcohol abuse, EXCEPT if all of the points are from question 1   No results found for any visits on 10/16/20.  Assessment & Plan    Routine Health Maintenance and Physical Exam  Exercise Activities and Dietary recommendations  Goals   None     Immunization History  Administered Date(s) Administered   Influenza,inj,Quad PF,6+ Mos 10/05/2016   Influenza-Unspecified 09/19/2017, 10/02/2019, 10/02/2020    Health Maintenance  Topic Date Due   COVID-19 Vaccine (1) Never done   HIV Screening  Never done   Hepatitis C Screening  Never done   TETANUS/TDAP  Never done   MAMMOGRAM  Never done   Zoster Vaccines- Shingrix (1 of 2) Never done   OPHTHALMOLOGY EXAM  11/05/2020   COLONOSCOPY (Pts 45-9yrs Insurance coverage will need to be confirmed)  07/25/2023   INFLUENZA VACCINE  Completed   HPV VACCINES  Aged Out   PAP SMEAR-Modifier  Discontinued    Discussed health  benefits of physical activity, and encouraged her to engage in regular exercise appropriate for her age and condition.  Annual physical exam - Plan: Lipid panel, TSH, CBC w/Diff/Platelet, Comprehensive Metabolic Panel (CMET), Hemoglobin A1c  Hypertension, unspecified type - Plan: Lipid panel, TSH, CBC w/Diff/Platelet, Comprehensive Metabolic Panel (CMET), Hemoglobin A1c  Iron deficiency anemia secondary to inadequate dietary iron intake - Plan: Lipid panel, TSH, CBC w/Diff/Platelet, Comprehensive Metabolic Panel (CMET), Hemoglobin A1c  Chemical diabetes - Plan: Lipid panel, TSH, CBC w/Diff/Platelet, Comprehensive Metabolic Panel (CMET), Hemoglobin A1c  Hypercholesterolemia - Plan: Lipid panel, TSH, CBC w/Diff/Platelet, Comprehensive Metabolic Panel (CMET), Hemoglobin A1c  Gastro-esophageal reflux disease without esophagitis - Plan: Lipid panel, TSH, CBC w/Diff/Platelet, Comprehensive Metabolic Panel (CMET), Hemoglobin A1c  Anxiety - Plan: Lipid panel, TSH, CBC w/Diff/Platelet, Comprehensive Metabolic Panel (CMET), Hemoglobin A1c  Recurrent major depressive disorder, in full remission (HCC) - Plan: Lipid panel, TSH, CBC w/Diff/Platelet, Comprehensive Metabolic Panel (CMET), Hemoglobin A1c  GAD (generalized anxiety disorder) - Plan: escitalopram (LEXAPRO) 5 MG tablet, buPROPion (WELLBUTRIN XL) 300 MG 24 hr tablet  No follow-ups on file.    I, Megan Mans, MD, have reviewed all documentation for this visit. The documentation on 10/26/20 for the exam, diagnosis, procedures, and orders are all accurate and complete.     Neala Miggins Wendelyn Breslow, MD  [facility] [phone] (phone) [phone] (fax)  [facility]

## 2020-10-20 ENCOUNTER — Other Ambulatory Visit: Payer: Self-pay | Admitting: Family Medicine

## 2020-10-20 DIAGNOSIS — F411 Generalized anxiety disorder: Secondary | ICD-10-CM

## 2020-10-20 NOTE — Telephone Encounter (Signed)
Medication Refill -  Medication: 90 day supply ALPRAZolam (XANAX) 0.5 MG tablet  Has the patient contacted their pharmacy? No.  (Agent: If yes, when and what did the pharmacy advise?) Patient went to the pharmacy and was advised no refills are on file. Patient was seen by PCP on 10/16/2020  Preferred Pharmacy (with phone number or street name):  [id number] [address] [place] [address] Phone:  [phone]  Fax:  [phone]      Has the patient been seen for an appointment in the last year OR does the patient have an upcoming appointment? Yes.    Agent: Please be advised that RX refills may take up to 3 business days. We ask that you follow-up with your pharmacy.  90 day supply ALPRAZolam (XANAX) 0.5 MG tablet

## 2020-10-21 MED ORDER — ALPRAZOLAM 0.5 MG PO TABS: 0.5000 mg | ORAL_TABLET | Freq: Two times a day (BID) | ORAL | 1 refills | Status: AC | PRN

## 2020-10-21 NOTE — Telephone Encounter (Signed)
Requested medication (s) are due for refill today:  Provider to review  Requested medication (s) are on the active medication list:   Yes  Future visit scheduled:   Yes   Last ordered: 10/16/2019 #180, 1 refill  Returned because it's a non delegated refill   Requested Prescriptions  Pending Prescriptions Disp Refills   ALPRAZolam (XANAX) 0.5 MG tablet 180 tablet 1    Sig: Take 1 tablet (0.5 mg total) by mouth 2 (two) times daily as needed.     Not Delegated - Psychiatry:  Anxiolytics/Hypnotics Failed - 10/20/2020  4:41 PM      Failed - This refill cannot be delegated      Failed - Urine Drug Screen completed in last 360 days      Passed - Valid encounter within last 6 months    Recent Outpatient Visits           5 days ago Annual physical exam   [facility] Maple Hudson., MD   1 year ago Annual physical exam   [facility] Maple Hudson., MD   2 years ago Annual physical exam   [facility] Maple Hudson., MD   3 years ago Annual physical exam   [facility] Maple Hudson., MD   4 years ago Annual physical exam   [facility] Maple Hudson., MD       Future Appointments             In 12 months Maple Hudson., MD [facility]

## 2020-10-26 MED ORDER — ALPRAZOLAM 0.5 MG PO TABS: 0.5000 mg | ORAL_TABLET | Freq: Two times a day (BID) | ORAL | 1 refills | Status: DC | PRN

## 2020-11-03 ENCOUNTER — Other Ambulatory Visit: Payer: Self-pay

## 2020-11-03 ENCOUNTER — Ambulatory Visit (INDEPENDENT_AMBULATORY_CARE_PROVIDER_SITE_OTHER): Payer: BC Managed Care – PPO | Admitting: Podiatry

## 2020-11-03 ENCOUNTER — Encounter: Payer: Self-pay | Admitting: Podiatry

## 2020-11-03 DIAGNOSIS — G5782 Other specified mononeuropathies of left lower limb: Secondary | ICD-10-CM | POA: Diagnosis not present

## 2020-11-03 NOTE — Progress Notes (Signed)
  Subjective:  Patient ID: Cindy Duncan, female    DOB: March 09, 1970,  MRN: [id number]  Chief Complaint  Patient presents with   Foot Pain    "It is not the greatest."    50 y.o. female presents with the above complaint. History confirmed with patient.  Continues to have a painful neuroma she last had an injection with Dr. Al Corpus in May of this year which helped until a few weeks ago  Objective:  Physical Exam: warm, good capillary refill, no trophic changes or ulcerative lesions, normal DP and PT pulses, and normal sensory exam. Left Foot:  Assessment:   1. 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   -Educated on etiology -Educated on padding and proper shoegear -XR reviewed with patient -Injection delivered to the affected interspaces -Also discussed surgical excision of the neuroma if this does not improve  Return if symptoms worsen or fail to improve.

## 2020-11-12 ENCOUNTER — Other Ambulatory Visit: Payer: Self-pay | Admitting: Family Medicine

## 2020-11-12 MED ORDER — ESTROGENS CONJUGATED 1.25 MG PO TABS: 1.2500 mg | ORAL_TABLET | Freq: Every day | ORAL | 1 refills | Status: AC

## 2020-11-12 NOTE — Telephone Encounter (Signed)
[facility] faxed refill request for the following medications:  estrogens, conjugated, (PREMARIN) 1.25 MG tablet   Please advise.

## 2020-11-12 NOTE — Telephone Encounter (Signed)
LOV: 10/16/2020  NOV: 10/20/2021  Last Refill

## 2021-02-16 ENCOUNTER — Other Ambulatory Visit: Payer: Self-pay | Admitting: Family Medicine

## 2021-02-25 ENCOUNTER — Other Ambulatory Visit: Payer: Self-pay

## 2021-02-25 ENCOUNTER — Ambulatory Visit (INDEPENDENT_AMBULATORY_CARE_PROVIDER_SITE_OTHER): Payer: BC Managed Care – PPO | Admitting: Podiatry

## 2021-02-25 ENCOUNTER — Encounter: Payer: Self-pay | Admitting: Podiatry

## 2021-02-25 DIAGNOSIS — G5782 Other specified mononeuropathies of left lower limb: Secondary | ICD-10-CM

## 2021-02-25 NOTE — Progress Notes (Signed)
She presents today for follow-up of her neuroma third interdigital space of her left foot.  States that I think and is ready to take it out as she refers to the painful neuroma.  Objective: Vital signs are stable she alert oriented x3 there is no erythema edema cellulitis drainage odor she does have a somewhat in size between her toes where she has a diastases between the third and fourth toes of the left foot.  She still has pain on palpation to the third interdigital space.  Assessment: Neuroma third interdigital space.  Plan: At this point I highly recommended excision so we went ahead and performed a consent form today with excision of neuroma she understood this and signed out the patient's consent form.  We did discuss possible postop complications which may include but not limited to postop pain bleeding swelling infection recurrence need further surgery overcorrection under correction also digit loss limb loss of life.  Follow-up with her in the near future for surgical intervention.

## 2021-03-03 ENCOUNTER — Telehealth: Payer: Self-pay | Admitting: Urology

## 2021-03-03 NOTE — Telephone Encounter (Signed)
DOS - 04/03/21  NEURECTOMY 3RD IDS LEFT --- [id number]  [employer] EFFECTIVE DATE - 01/04/21  PLAN DEDUCTIBLE - $3,500.00 W/ $3,219.72 REMAINING  OUT OF POCKET -  $5,500.00 W/ $5,219.72 REMAINING COINSURANCE - 15% COPAY - $0.00   NO PRIOR AUTH REQUIRED.

## 2021-04-01 ENCOUNTER — Other Ambulatory Visit: Payer: Self-pay | Admitting: Podiatry

## 2021-04-01 MED ORDER — CEPHALEXIN 500 MG PO CAPS: 500.0000 mg | ORAL_CAPSULE | Freq: Three times a day (TID) | ORAL | 0 refills | Status: DC

## 2021-04-01 MED ORDER — OXYCODONE-ACETAMINOPHEN 10-325 MG PO TABS: 1.0000 | ORAL_TABLET | Freq: Three times a day (TID) | ORAL | 0 refills | Status: AC | PRN

## 2021-04-01 MED ORDER — ONDANSETRON HCL 4 MG PO TABS: 4.0000 mg | ORAL_TABLET | Freq: Three times a day (TID) | ORAL | 0 refills | Status: AC | PRN

## 2021-04-03 DIAGNOSIS — G5762 Lesion of plantar nerve, left lower limb: Secondary | ICD-10-CM | POA: Diagnosis not present

## 2021-04-03 DIAGNOSIS — G5782 Other specified mononeuropathies of left lower limb: Secondary | ICD-10-CM | POA: Diagnosis not present

## 2021-04-03 DIAGNOSIS — M25572 Pain in left ankle and joints of left foot: Secondary | ICD-10-CM | POA: Diagnosis not present

## 2021-04-08 ENCOUNTER — Ambulatory Visit (INDEPENDENT_AMBULATORY_CARE_PROVIDER_SITE_OTHER): Payer: BC Managed Care – PPO | Admitting: Podiatry

## 2021-04-08 ENCOUNTER — Encounter: Payer: Self-pay | Admitting: Podiatry

## 2021-04-08 DIAGNOSIS — G5782 Other specified mononeuropathies of left lower limb: Secondary | ICD-10-CM | POA: Diagnosis not present

## 2021-04-08 DIAGNOSIS — Z9889 Other specified postprocedural states: Secondary | ICD-10-CM

## 2021-04-08 NOTE — Progress Notes (Signed)
She presents today for her first postop visit she is neurectomy third interdigital space of the left foot.  States that is feeling good I do not have the pain anymore. ? ?Objective: Vital signs stable alert oriented x3 there is no erythema edema cellulitis drainage or odor dry sterile dressing is intact margins remain well coapted. ? ?Assessment: Well-healing surgical toe. ? ?Plan: Redressed today dressed a compressive dressing follow-up with me in 1 week for suture removal. ?

## 2021-04-09 ENCOUNTER — Encounter: Payer: Self-pay | Admitting: Podiatry

## 2021-04-14 ENCOUNTER — Other Ambulatory Visit: Payer: Self-pay | Admitting: Family Medicine

## 2021-04-14 DIAGNOSIS — L940 Localized scleroderma [morphea]: Secondary | ICD-10-CM | POA: Diagnosis not present

## 2021-04-14 DIAGNOSIS — D225 Melanocytic nevi of trunk: Secondary | ICD-10-CM | POA: Diagnosis not present

## 2021-04-14 DIAGNOSIS — L309 Dermatitis, unspecified: Secondary | ICD-10-CM | POA: Diagnosis not present

## 2021-04-14 DIAGNOSIS — D2261 Melanocytic nevi of right upper limb, including shoulder: Secondary | ICD-10-CM | POA: Diagnosis not present

## 2021-04-14 DIAGNOSIS — D2262 Melanocytic nevi of left upper limb, including shoulder: Secondary | ICD-10-CM | POA: Diagnosis not present

## 2021-04-14 DIAGNOSIS — D2272 Melanocytic nevi of left lower limb, including hip: Secondary | ICD-10-CM | POA: Diagnosis not present

## 2021-04-14 DIAGNOSIS — F411 Generalized anxiety disorder: Secondary | ICD-10-CM

## 2021-04-15 ENCOUNTER — Ambulatory Visit (INDEPENDENT_AMBULATORY_CARE_PROVIDER_SITE_OTHER): Payer: BC Managed Care – PPO | Admitting: Podiatry

## 2021-04-15 ENCOUNTER — Encounter: Payer: Self-pay | Admitting: Podiatry

## 2021-04-15 DIAGNOSIS — G5782 Other specified mononeuropathies of left lower limb: Secondary | ICD-10-CM | POA: Diagnosis not present

## 2021-04-15 DIAGNOSIS — Z9889 Other specified postprocedural states: Secondary | ICD-10-CM

## 2021-04-15 NOTE — Progress Notes (Signed)
She presents today for postop visit date of surgery 04/03/2021 she had a neurectomy to the third interdigital space of the left foot.  States that it feels good I does have a raw feeling on the bottom of my third and fourth toes on that foot and I really hated that little black shoe it felt like a flipper.  She denies fever chills nausea vomit muscle aches pains calf pain back pain chest pain shortness of breath ready to get back to her activities and to take a shower. ? ?Objective: Vital signs are stable alert and oriented x3.  Pulses are palpable.  There is no erythema mild edema no cellulitis drainage or odor.  Sutures at the proximal and distal end of the incision were removed today.  These were dissolvable none dyed Monocryl stitches. ? ?Assessment: Well-healing surgical foot status post neurectomy third interdigital space left. ? ?Plan: At this point removed all of the suture ends and placed Steri-Strips placed a light tape dressing and we will follow-up with her in 2 to 3 weeks. ?

## 2021-04-23 ENCOUNTER — Other Ambulatory Visit: Payer: Self-pay | Admitting: Family Medicine

## 2021-04-23 DIAGNOSIS — F411 Generalized anxiety disorder: Secondary | ICD-10-CM

## 2021-04-27 DIAGNOSIS — Z0189 Encounter for other specified special examinations: Secondary | ICD-10-CM | POA: Diagnosis not present

## 2021-04-28 DIAGNOSIS — E785 Hyperlipidemia, unspecified: Secondary | ICD-10-CM | POA: Diagnosis not present

## 2021-04-28 DIAGNOSIS — Z043 Encounter for examination and observation following other accident: Secondary | ICD-10-CM | POA: Diagnosis not present

## 2021-04-28 DIAGNOSIS — Z713 Dietary counseling and surveillance: Secondary | ICD-10-CM | POA: Diagnosis not present

## 2021-04-29 ENCOUNTER — Ambulatory Visit (INDEPENDENT_AMBULATORY_CARE_PROVIDER_SITE_OTHER): Payer: BC Managed Care – PPO | Admitting: Podiatry

## 2021-04-29 ENCOUNTER — Encounter: Payer: Self-pay | Admitting: Podiatry

## 2021-04-29 DIAGNOSIS — G5782 Other specified mononeuropathies of left lower limb: Secondary | ICD-10-CM

## 2021-04-29 NOTE — Progress Notes (Signed)
She presents today for her third postop visit date of surgery 04/03/2021 neurectomy third interdigital space left foot.  States that is doing just fine. ? ?Objective: Vital signs stable alert and oriented x3 has no reproducible pain on palpation his incision site is gone on to heal very nicely. ? ?Assessment: Well-healing surgical foot third interdigital space neurectomy left. ? ?Plan: Follow-up with me on an as-needed basis. ?

## 2021-05-13 ENCOUNTER — Encounter: Payer: BC Managed Care – PPO | Admitting: Podiatry

## 2021-06-19 DIAGNOSIS — E119 Type 2 diabetes mellitus without complications: Secondary | ICD-10-CM | POA: Diagnosis not present

## 2021-07-09 DIAGNOSIS — R197 Diarrhea, unspecified: Secondary | ICD-10-CM | POA: Diagnosis not present

## 2021-07-09 DIAGNOSIS — R194 Change in bowel habit: Secondary | ICD-10-CM | POA: Diagnosis not present

## 2021-07-09 DIAGNOSIS — R152 Fecal urgency: Secondary | ICD-10-CM | POA: Diagnosis not present

## 2021-07-10 ENCOUNTER — Other Ambulatory Visit: Payer: Self-pay | Admitting: Family Medicine

## 2021-07-10 ENCOUNTER — Telehealth: Payer: Self-pay

## 2021-07-10 ENCOUNTER — Ambulatory Visit
Admission: RE | Admit: 2021-07-10 | Discharge: 2021-07-10 | Disposition: A | Discharge status: Home / Self Care | Payer: Self-pay | Source: Ambulatory Visit | Attending: Family Medicine | Admitting: Family Medicine

## 2021-07-10 DIAGNOSIS — R152 Fecal urgency: Secondary | ICD-10-CM

## 2021-07-10 DIAGNOSIS — R197 Diarrhea, unspecified: Secondary | ICD-10-CM | POA: Diagnosis not present

## 2021-07-10 NOTE — Telephone Encounter (Signed)
Call report for Cindy Duncan, [facility] with Radiology calling with reports on patient abdomen imaging results. States: Nonobstructive bowel gas patterns. Air fluid levels throughout the colon can be see in the setting of diarrhea.  FYI- Report in patient's chart

## 2021-07-21 DIAGNOSIS — Z0189 Encounter for other specified special examinations: Secondary | ICD-10-CM | POA: Diagnosis not present

## 2021-07-22 DIAGNOSIS — Z7189 Other specified counseling: Secondary | ICD-10-CM | POA: Diagnosis not present

## 2021-07-27 DIAGNOSIS — K580 Irritable bowel syndrome with diarrhea: Secondary | ICD-10-CM | POA: Diagnosis not present

## 2021-07-27 DIAGNOSIS — R194 Change in bowel habit: Secondary | ICD-10-CM | POA: Diagnosis not present

## 2021-08-17 DIAGNOSIS — B379 Candidiasis, unspecified: Secondary | ICD-10-CM | POA: Diagnosis not present

## 2021-08-31 DIAGNOSIS — K52831 Collagenous colitis: Secondary | ICD-10-CM | POA: Diagnosis not present

## 2021-08-31 DIAGNOSIS — K648 Other hemorrhoids: Secondary | ICD-10-CM | POA: Diagnosis not present

## 2021-08-31 DIAGNOSIS — R194 Change in bowel habit: Secondary | ICD-10-CM | POA: Diagnosis not present

## 2021-08-31 DIAGNOSIS — K621 Rectal polyp: Secondary | ICD-10-CM | POA: Diagnosis not present

## 2021-09-03 DIAGNOSIS — L900 Lichen sclerosus et atrophicus: Secondary | ICD-10-CM | POA: Diagnosis not present

## 2021-09-03 DIAGNOSIS — L940 Localized scleroderma [morphea]: Secondary | ICD-10-CM | POA: Diagnosis not present

## 2021-09-14 ENCOUNTER — Encounter: Payer: Self-pay | Admitting: Family Medicine

## 2021-09-15 ENCOUNTER — Other Ambulatory Visit: Payer: Self-pay

## 2021-09-15 DIAGNOSIS — F411 Generalized anxiety disorder: Secondary | ICD-10-CM

## 2021-09-15 MED ORDER — ESCITALOPRAM OXALATE 5 MG PO TABS: 5.0000 mg | ORAL_TABLET | Freq: Every day | ORAL | 1 refills | Status: AC

## 2021-09-15 MED ORDER — BUPROPION HCL ER (XL) 300 MG PO TB24: 300.0000 mg | ORAL_TABLET | Freq: Every day | ORAL | 3 refills | Status: AC

## 2021-09-15 MED ORDER — ALPRAZOLAM 0.5 MG PO TABS: 0.5000 mg | ORAL_TABLET | Freq: Two times a day (BID) | ORAL | 1 refills | Status: AC | PRN

## 2021-09-15 NOTE — Telephone Encounter (Signed)
LOV 10/16/20 NOV 10/20/21 LRF Bupropion 04/23/21 90 x 3 (this one should be good for a while) LRF Lexapro 04/14/21 90 x 1 Xanax 10/26/20 180 x 1

## 2021-09-28 DIAGNOSIS — Z1231 Encounter for screening mammogram for malignant neoplasm of breast: Secondary | ICD-10-CM | POA: Diagnosis not present

## 2021-09-28 DIAGNOSIS — Z6825 Body mass index (BMI) 25.0-25.9, adult: Secondary | ICD-10-CM | POA: Diagnosis not present

## 2021-09-28 DIAGNOSIS — Z01419 Encounter for gynecological examination (general) (routine) without abnormal findings: Secondary | ICD-10-CM | POA: Diagnosis not present

## 2021-09-28 DIAGNOSIS — Z1272 Encounter for screening for malignant neoplasm of vagina: Secondary | ICD-10-CM | POA: Diagnosis not present

## 2021-09-29 DIAGNOSIS — Z23 Encounter for immunization: Secondary | ICD-10-CM | POA: Diagnosis not present

## 2021-10-20 ENCOUNTER — Encounter: Payer: BC Managed Care – PPO | Admitting: Family Medicine

## 2021-10-28 DIAGNOSIS — K5900 Constipation, unspecified: Secondary | ICD-10-CM | POA: Diagnosis not present

## 2021-10-28 DIAGNOSIS — K219 Gastro-esophageal reflux disease without esophagitis: Secondary | ICD-10-CM | POA: Diagnosis not present

## 2021-10-28 DIAGNOSIS — K52831 Collagenous colitis: Secondary | ICD-10-CM | POA: Diagnosis not present

## 2021-10-28 DIAGNOSIS — L900 Lichen sclerosus et atrophicus: Secondary | ICD-10-CM | POA: Diagnosis not present

## 2021-12-17 DIAGNOSIS — Z0000 Encounter for general adult medical examination without abnormal findings: Secondary | ICD-10-CM | POA: Diagnosis not present

## 2021-12-17 DIAGNOSIS — I10 Essential (primary) hypertension: Secondary | ICD-10-CM | POA: Diagnosis not present

## 2021-12-17 DIAGNOSIS — E7800 Pure hypercholesterolemia, unspecified: Secondary | ICD-10-CM | POA: Diagnosis not present

## 2021-12-17 DIAGNOSIS — E118 Type 2 diabetes mellitus with unspecified complications: Secondary | ICD-10-CM | POA: Diagnosis not present

## 2022-02-09 ENCOUNTER — Telehealth: Payer: Self-pay | Admitting: Family Medicine

## 2022-02-09 NOTE — Telephone Encounter (Signed)
[facility] faxed refill request for the following medications:   hydrochlorothiazide (MICROZIDE) 12.5 MG capsule     Please advise.

## 2022-03-23 DIAGNOSIS — M25552 Pain in left hip: Secondary | ICD-10-CM | POA: Diagnosis not present

## 2022-04-06 DIAGNOSIS — M25562 Pain in left knee: Secondary | ICD-10-CM | POA: Diagnosis not present

## 2022-04-06 DIAGNOSIS — M25552 Pain in left hip: Secondary | ICD-10-CM | POA: Diagnosis not present

## 2022-04-12 DIAGNOSIS — M25552 Pain in left hip: Secondary | ICD-10-CM | POA: Diagnosis not present

## 2022-04-12 DIAGNOSIS — M25562 Pain in left knee: Secondary | ICD-10-CM | POA: Diagnosis not present

## 2022-04-14 DIAGNOSIS — M25552 Pain in left hip: Secondary | ICD-10-CM | POA: Diagnosis not present

## 2022-04-14 DIAGNOSIS — M25562 Pain in left knee: Secondary | ICD-10-CM | POA: Diagnosis not present

## 2022-04-19 DIAGNOSIS — L900 Lichen sclerosus et atrophicus: Secondary | ICD-10-CM | POA: Diagnosis not present

## 2022-04-19 DIAGNOSIS — M25552 Pain in left hip: Secondary | ICD-10-CM | POA: Diagnosis not present

## 2022-04-19 DIAGNOSIS — L570 Actinic keratosis: Secondary | ICD-10-CM | POA: Diagnosis not present

## 2022-04-19 DIAGNOSIS — L940 Localized scleroderma [morphea]: Secondary | ICD-10-CM | POA: Diagnosis not present

## 2022-04-19 DIAGNOSIS — M25562 Pain in left knee: Secondary | ICD-10-CM | POA: Diagnosis not present

## 2022-04-19 DIAGNOSIS — D225 Melanocytic nevi of trunk: Secondary | ICD-10-CM | POA: Diagnosis not present

## 2022-04-19 DIAGNOSIS — X32XXXA Exposure to sunlight, initial encounter: Secondary | ICD-10-CM | POA: Diagnosis not present

## 2022-04-19 DIAGNOSIS — D2261 Melanocytic nevi of right upper limb, including shoulder: Secondary | ICD-10-CM | POA: Diagnosis not present

## 2022-04-21 DIAGNOSIS — M25562 Pain in left knee: Secondary | ICD-10-CM | POA: Diagnosis not present

## 2022-04-21 DIAGNOSIS — M25552 Pain in left hip: Secondary | ICD-10-CM | POA: Diagnosis not present

## 2022-04-26 DIAGNOSIS — M25562 Pain in left knee: Secondary | ICD-10-CM | POA: Diagnosis not present

## 2022-04-26 DIAGNOSIS — M25552 Pain in left hip: Secondary | ICD-10-CM | POA: Diagnosis not present

## 2022-04-29 DIAGNOSIS — M25562 Pain in left knee: Secondary | ICD-10-CM | POA: Diagnosis not present

## 2022-04-29 DIAGNOSIS — M25552 Pain in left hip: Secondary | ICD-10-CM | POA: Diagnosis not present

## 2022-05-05 DIAGNOSIS — M25552 Pain in left hip: Secondary | ICD-10-CM | POA: Diagnosis not present

## 2022-05-05 DIAGNOSIS — M25562 Pain in left knee: Secondary | ICD-10-CM | POA: Diagnosis not present

## 2022-05-06 DIAGNOSIS — M25562 Pain in left knee: Secondary | ICD-10-CM | POA: Diagnosis not present

## 2022-05-06 DIAGNOSIS — M25552 Pain in left hip: Secondary | ICD-10-CM | POA: Diagnosis not present

## 2022-05-10 DIAGNOSIS — M25552 Pain in left hip: Secondary | ICD-10-CM | POA: Diagnosis not present

## 2022-05-10 DIAGNOSIS — M25562 Pain in left knee: Secondary | ICD-10-CM | POA: Diagnosis not present

## 2022-05-13 DIAGNOSIS — M25562 Pain in left knee: Secondary | ICD-10-CM | POA: Diagnosis not present

## 2022-05-13 DIAGNOSIS — M25552 Pain in left hip: Secondary | ICD-10-CM | POA: Diagnosis not present

## 2022-06-22 DIAGNOSIS — I10 Essential (primary) hypertension: Secondary | ICD-10-CM | POA: Diagnosis not present

## 2022-06-22 DIAGNOSIS — E118 Type 2 diabetes mellitus with unspecified complications: Secondary | ICD-10-CM | POA: Diagnosis not present

## 2022-06-22 DIAGNOSIS — F419 Anxiety disorder, unspecified: Secondary | ICD-10-CM | POA: Diagnosis not present

## 2022-06-22 DIAGNOSIS — E7800 Pure hypercholesterolemia, unspecified: Secondary | ICD-10-CM | POA: Diagnosis not present

## 2022-07-20 DIAGNOSIS — E119 Type 2 diabetes mellitus without complications: Secondary | ICD-10-CM | POA: Diagnosis not present

## 2022-09-30 DIAGNOSIS — Z1231 Encounter for screening mammogram for malignant neoplasm of breast: Secondary | ICD-10-CM | POA: Diagnosis not present

## 2022-09-30 DIAGNOSIS — Z1272 Encounter for screening for malignant neoplasm of vagina: Secondary | ICD-10-CM | POA: Diagnosis not present

## 2022-09-30 DIAGNOSIS — Z6826 Body mass index (BMI) 26.0-26.9, adult: Secondary | ICD-10-CM | POA: Diagnosis not present

## 2022-09-30 DIAGNOSIS — Z01419 Encounter for gynecological examination (general) (routine) without abnormal findings: Secondary | ICD-10-CM | POA: Diagnosis not present

## 2022-10-07 ENCOUNTER — Other Ambulatory Visit: Payer: Self-pay | Admitting: Internal Medicine

## 2022-10-07 DIAGNOSIS — I10 Essential (primary) hypertension: Secondary | ICD-10-CM

## 2022-10-11 ENCOUNTER — Ambulatory Visit
Admission: RE | Admit: 2022-10-11 | Discharge: 2022-10-11 | Disposition: A | Discharge status: Home / Self Care | Payer: BC Managed Care – PPO | Source: Ambulatory Visit | Attending: Internal Medicine | Admitting: Internal Medicine

## 2022-10-11 DIAGNOSIS — I10 Essential (primary) hypertension: Secondary | ICD-10-CM | POA: Insufficient documentation
